# Patient Record
Sex: Male | Born: 1960 | Race: White | Hispanic: No | Marital: Single | Smoking: Current every day smoker
Health system: Southern US, Community
[De-identification: ages and names within clinical notes are randomized; demographics above are authoritative.]

## PROBLEM LIST (undated history)

## (undated) DIAGNOSIS — I10 Essential (primary) hypertension: Secondary | ICD-10-CM

## (undated) DIAGNOSIS — C189 Malignant neoplasm of colon, unspecified: Secondary | ICD-10-CM

## (undated) DIAGNOSIS — I509 Heart failure, unspecified: Secondary | ICD-10-CM

## (undated) DIAGNOSIS — F1910 Other psychoactive substance abuse, uncomplicated: Secondary | ICD-10-CM

## (undated) DIAGNOSIS — R739 Hyperglycemia, unspecified: Secondary | ICD-10-CM

## (undated) DIAGNOSIS — J811 Chronic pulmonary edema: Secondary | ICD-10-CM

## (undated) DIAGNOSIS — E785 Hyperlipidemia, unspecified: Secondary | ICD-10-CM

## (undated) DIAGNOSIS — F17200 Nicotine dependence, unspecified, uncomplicated: Secondary | ICD-10-CM

## (undated) DIAGNOSIS — E119 Type 2 diabetes mellitus without complications: Secondary | ICD-10-CM

## (undated) DIAGNOSIS — I2510 Atherosclerotic heart disease of native coronary artery without angina pectoris: Secondary | ICD-10-CM

## (undated) DIAGNOSIS — J9601 Acute respiratory failure with hypoxia: Secondary | ICD-10-CM

## (undated) DIAGNOSIS — I214 Non-ST elevation (NSTEMI) myocardial infarction: Secondary | ICD-10-CM

## (undated) DIAGNOSIS — J449 Chronic obstructive pulmonary disease, unspecified: Secondary | ICD-10-CM

## (undated) HISTORY — DX: Chronic obstructive pulmonary disease, unspecified: J44.9

## (undated) HISTORY — DX: Malignant neoplasm of colon, unspecified: C18.9

## (undated) HISTORY — DX: Non-ST elevation (NSTEMI) myocardial infarction: I21.4

## (undated) HISTORY — DX: Chronic pulmonary edema: J81.1

## (undated) HISTORY — DX: Other psychoactive substance abuse, uncomplicated: F19.10

## (undated) HISTORY — PX: KNEE SURGERY: SHX244

## (undated) HISTORY — PX: COLONOSCOPY: SHX174

## (undated) HISTORY — PX: COLON RESECTION: SHX5231

## (undated) HISTORY — DX: Acute respiratory failure with hypoxia: J96.01

## (undated) HISTORY — DX: Heart failure, unspecified: I50.9

## (undated) HISTORY — DX: Hyperlipidemia, unspecified: E78.5

## (undated) HISTORY — DX: Nicotine dependence, unspecified, uncomplicated: F17.200

## (undated) HISTORY — DX: Type 2 diabetes mellitus without complications: E11.9

## (undated) HISTORY — DX: Hyperglycemia, unspecified: R73.9

## (undated) HISTORY — DX: Atherosclerotic heart disease of native coronary artery without angina pectoris: I25.10

## (undated) HISTORY — DX: Essential (primary) hypertension: I10

---

## 2007-06-13 ENCOUNTER — Emergency Department (HOSPITAL_COMMUNITY): Admission: EM | Admit: 2007-06-13 | Discharge: 2007-06-14 | Payer: Self-pay | Admitting: Emergency Medicine

## 2007-06-17 ENCOUNTER — Emergency Department (HOSPITAL_COMMUNITY): Admission: EM | Admit: 2007-06-17 | Discharge: 2007-06-18 | Payer: Self-pay | Admitting: Emergency Medicine

## 2008-01-23 ENCOUNTER — Inpatient Hospital Stay (HOSPITAL_COMMUNITY): Admission: EM | Admit: 2008-01-23 | Discharge: 2008-01-24 | Payer: Self-pay | Admitting: Emergency Medicine

## 2008-01-24 ENCOUNTER — Emergency Department (HOSPITAL_COMMUNITY): Admission: EM | Admit: 2008-01-24 | Discharge: 2008-01-25 | Payer: Self-pay | Admitting: Emergency Medicine

## 2008-01-25 ENCOUNTER — Encounter: Payer: Self-pay | Admitting: Emergency Medicine

## 2008-01-26 ENCOUNTER — Emergency Department (HOSPITAL_COMMUNITY): Admission: EM | Admit: 2008-01-26 | Discharge: 2008-01-26 | Payer: Self-pay | Admitting: Emergency Medicine

## 2008-11-30 IMAGING — CT CT ABDOMEN W/ CM
3 of 7 series · 15 of 46 positions shown, 17 images · IV contrast (agent unspecified)
Comparison: 06/18/2007

CT ABDOMEN

CLINICAL DATA: Abdominal pain

CT ABDOMEN AND PELVIS WITH CONTRAST
TECHNIQUE: Multidetector CT imaging of the abdomen and pelvis was
performed using the standard protocol following bolus
administration of intravenous contrast.
Contrast: 100 ml Imnipaque-GDD

[Series 2: abd_pel 5.0 b40f st · axial · 0.69mm/px · z∈[-520,-114]mm · 10 of 101 slices shown, 12 images]
[im 10/101  soft-tissue]
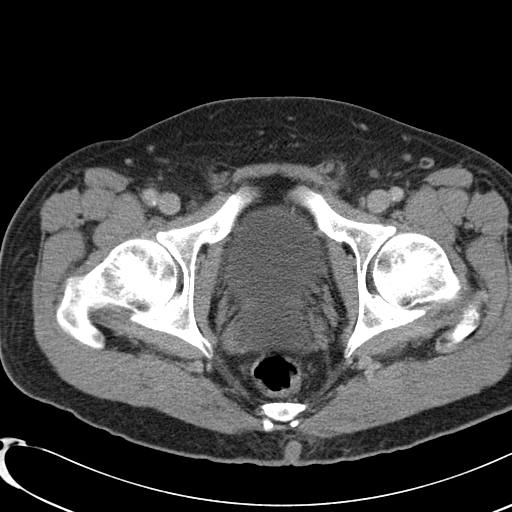
[im 10/101  bone]
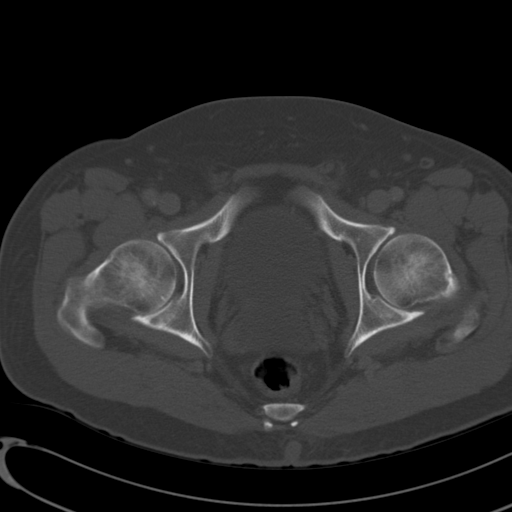
[im 19/101  soft-tissue]
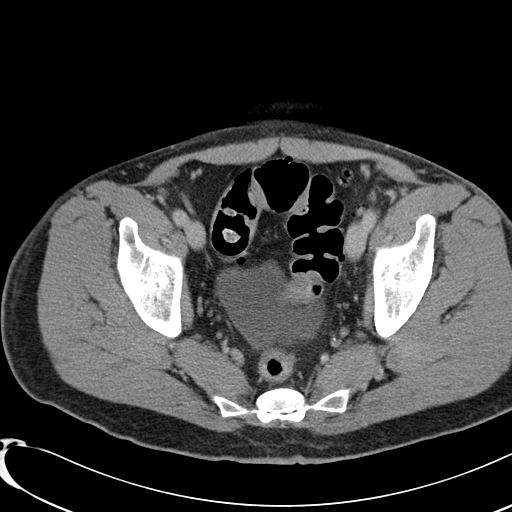
[im 28/101  soft-tissue]
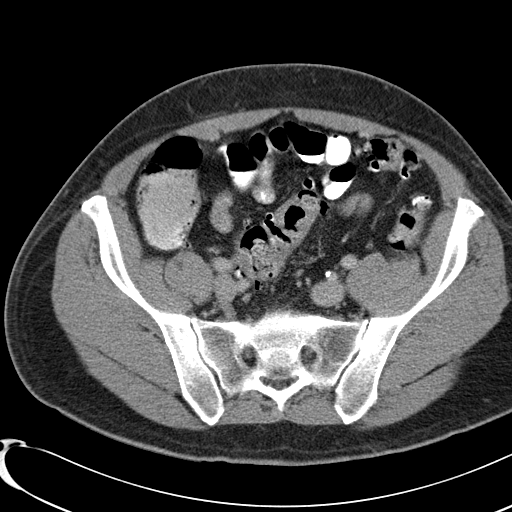
[im 37/101  soft-tissue]
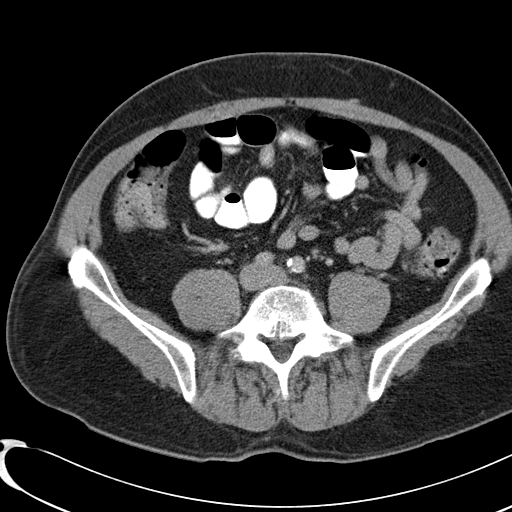
[im 46/101  soft-tissue]
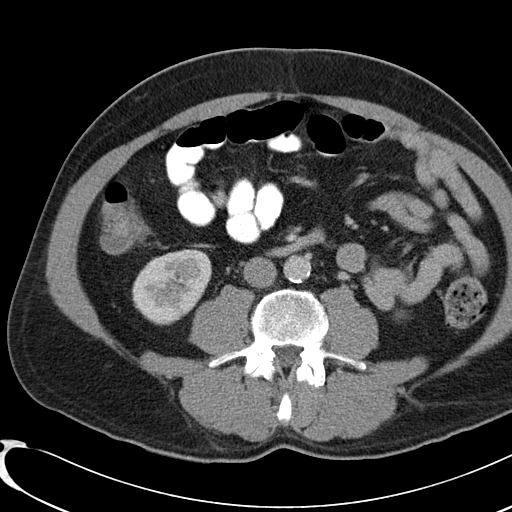
[im 55/101  soft-tissue]
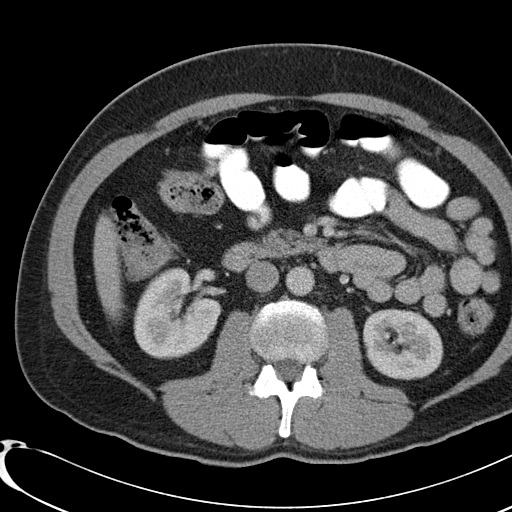
[im 64/101  soft-tissue]
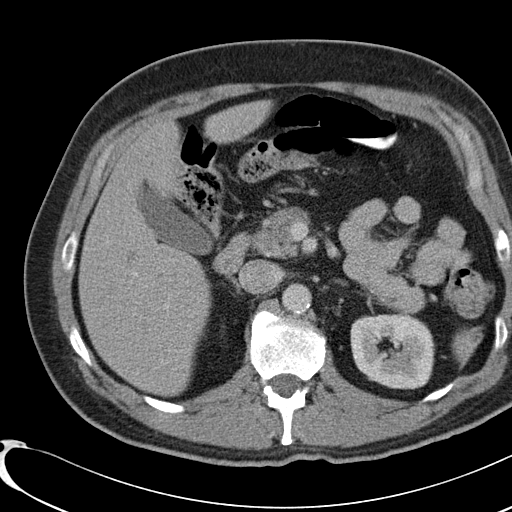
[im 73/101  soft-tissue]
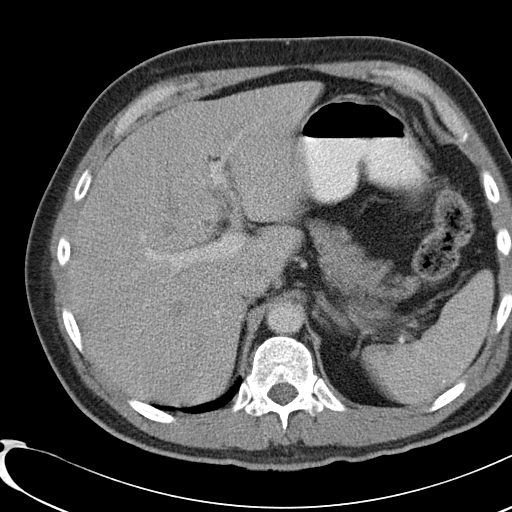
[im 82/101  soft-tissue]
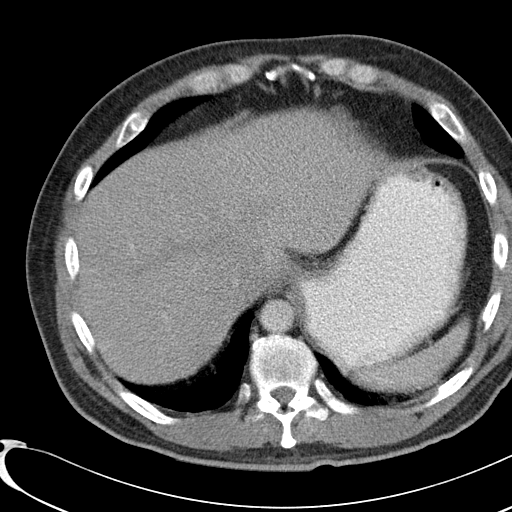
[im 82/101  bone]
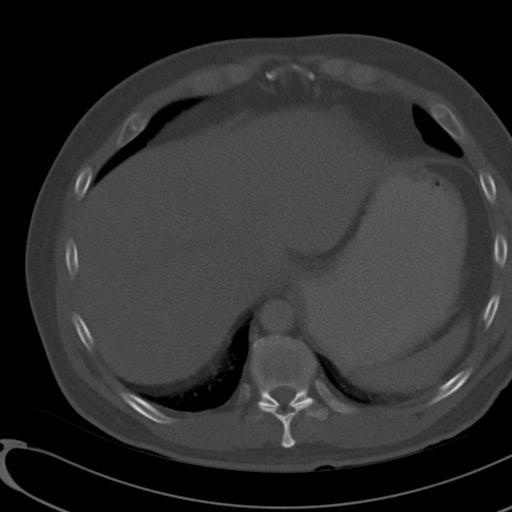
[im 91/101  soft-tissue]
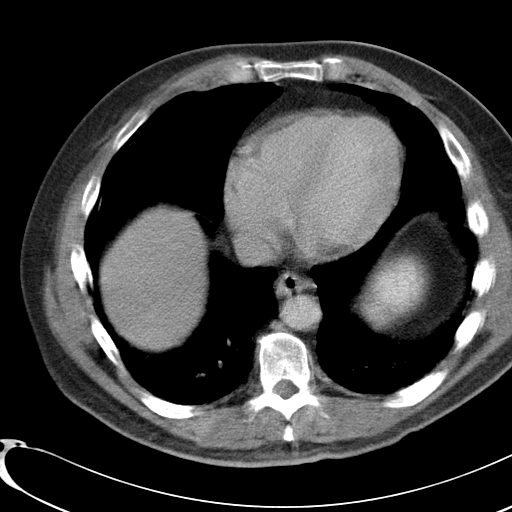

[Series 4: abd_pel 5.0 b60f lung · axial · 0.73mm/px · z∈[-164,-114]mm · 2 of 31 slices shown]
[im 11/31  bone]
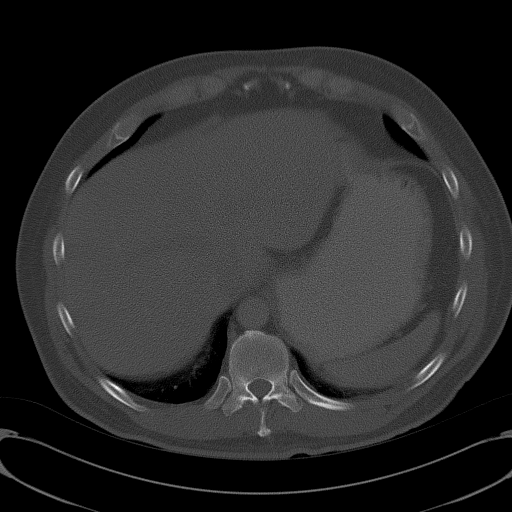
[im 21/31  bone]
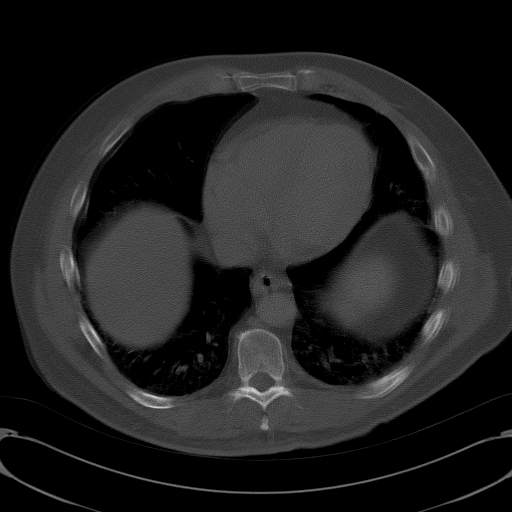

[Series 602: <mpr thick range> · coronal · 1.01mm/px · 3 of 81 slices shown]
[im 27/81  soft-tissue]
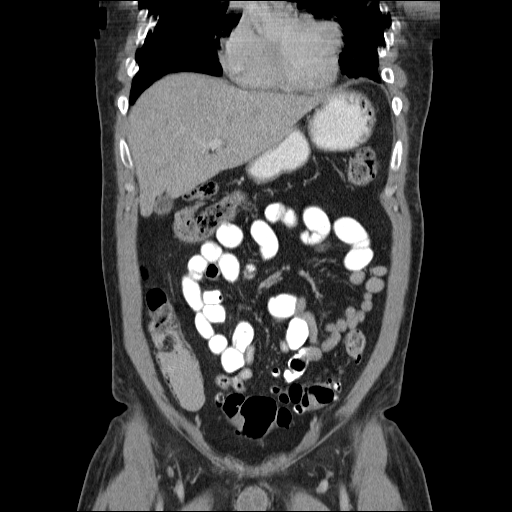
[im 36/81  soft-tissue]
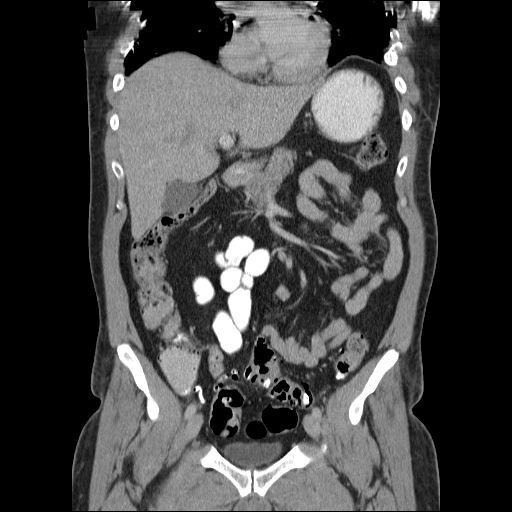
[im 45/81  soft-tissue]
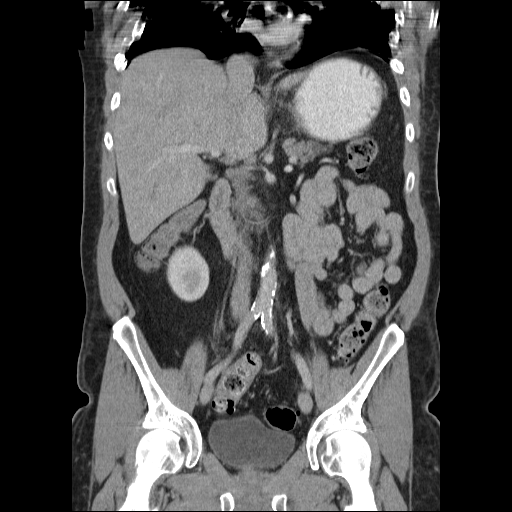

[15 of 46 positions shown; findings below may reference images not displayed]

FINDINGS: Limited evaluation of the lung bases due to motion
artifact.  The liver is unremarkable.  No biliary dilatation.
There is a small fundal nodule in the gallbladder which may be a
polyp or focal adenomyoma.  This is stable.

The spleen, pancreas, adrenal glands and kidneys are unremarkable.
There are a few tiny right renal calculi.

The aorta is normal in caliber.  Mild atherosclerotic changes
distally.

The stomach, duodenum, small bowel and colon demonstrate no
significant abnormalities.  No mesenteric or retroperitoneal masses
or adenopathy.  Scattered colonic diverticuli are noted.  No
significant bony findings.
IMPRESSION: 1.  No acute abdominal findings, mass lesions or adenopathy.

CT PELVIS
FINDINGS: The rectum, sigmoid colon and visualized small bowel
loops are unremarkable except for diverticulosis of the sigmoid
colon.  There is associated muscular thickening but no
diverticulitis.  The appendix is visualized and is normal.  The
bladder is unremarkable.  No pelvic masses, adenopathy or free
pelvic fluid collections.  The bony pelvis is intact.  No inguinal
mass or hernia.
IMPRESSION: 1.  Diverticulosis of the sigmoid colon.  No evidence for acute
diverticulitis.
2.  No pelvic masses, adenopathy or free pelvic fluid collections.
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
## 2011-04-26 LAB — COMPREHENSIVE METABOLIC PANEL
AST: 20
Alkaline Phosphatase: 54
BUN: 8
CO2: 24
Calcium: 9.1
Creatinine, Ser: 1.08
Glucose, Bld: 97
Total Bilirubin: 0.8
Total Protein: 6.7

## 2011-04-26 LAB — DIFFERENTIAL
Basophils Absolute: 0
Basophils Absolute: 0.1
Basophils Relative: 1
Eosinophils Absolute: 0.1
Lymphs Abs: 3.7
Monocytes Absolute: 0.7
Monocytes Relative: 9
Neutro Abs: 4
Neutro Abs: 4.1
Neutrophils Relative %: 48

## 2011-04-26 LAB — BASIC METABOLIC PANEL
BUN: 7
Chloride: 107
Chloride: 109
GFR calc Af Amer: >60
GFR calc non Af Amer: >60
GFR calc non Af Amer: >60

## 2011-04-26 LAB — URINALYSIS, ROUTINE W REFLEX MICROSCOPIC
Bilirubin Urine: NEGATIVE
Bilirubin Urine: NEGATIVE
Glucose, UA: NEGATIVE
Ketones, ur: NEGATIVE
Ketones, ur: NEGATIVE
Nitrite: NEGATIVE
Specific Gravity, Urine: 1.007
pH: 6

## 2011-04-26 LAB — CBC
HCT: 39.4
HCT: 42
HCT: 44.3
Hemoglobin: 13.6
MCHC: 34.5
MCV: 96.8
MCV: 98.1
RBC: 4.06 — ABNORMAL LOW
WBC: 8.7
WBC: 8.7

## 2011-04-26 LAB — TRICYCLICS SCREEN, URINE: TCA Scrn: NOT DETECTED

## 2011-04-26 LAB — SALICYLATE LEVEL: Salicylate Lvl: <4

## 2011-04-26 LAB — ACETAMINOPHEN LEVEL: Acetaminophen (Tylenol), Serum: <10 — ABNORMAL LOW

## 2011-04-26 LAB — RAPID URINE DRUG SCREEN, HOSP PERFORMED
Cocaine: NOT DETECTED
Opiates: NOT DETECTED

## 2011-04-26 LAB — ETHANOL: Alcohol, Ethyl (B): 185 — ABNORMAL HIGH

## 2011-04-26 LAB — LIPASE, BLOOD: Lipase: 16

## 2011-05-08 LAB — CBC
HCT: 46.5
Hemoglobin: 16.6
Hemoglobin: 15.9
MCHC: 34.3
MCV: 97.6
RBC: 4.88
RBC: 4.77
RDW: 14.2
WBC: 9.2

## 2011-05-08 LAB — DIFFERENTIAL
Basophils Absolute: 0
Basophils Relative: 0
Eosinophils Absolute: 0 — ABNORMAL LOW
Lymphocytes Relative: 35
Lymphs Abs: 3.9
Monocytes Absolute: 0.8
Monocytes Absolute: 0.6
Monocytes Relative: 6
Neutro Abs: 5.8
Neutrophils Relative %: 56
Neutrophils Relative %: 52

## 2011-05-08 LAB — I-STAT 8, (EC8 V) (CONVERTED LAB)
Acid-base deficit: 2
BUN: 10
Chloride: 114 — ABNORMAL HIGH
HCT: 51
Hemoglobin: 17.3 — ABNORMAL HIGH
Operator id: 272551
Potassium: 3.9
Sodium: 144
pCO2, Ven: 34.5 — ABNORMAL LOW

## 2011-05-08 LAB — RAPID URINE DRUG SCREEN, HOSP PERFORMED
Barbiturates: NOT DETECTED
Opiates: NOT DETECTED

## 2011-05-08 LAB — ETHANOL: Alcohol, Ethyl (B): 83 — ABNORMAL HIGH

## 2011-05-08 LAB — HEPATIC FUNCTION PANEL
ALT: 42
AST: 42 — ABNORMAL HIGH
Albumin: 4
Bilirubin, Direct: 0.2

## 2011-05-08 LAB — POCT I-STAT CREATININE: Creatinine, Ser: 1.2

## 2020-04-28 ENCOUNTER — Other Ambulatory Visit: Payer: Self-pay

## 2020-04-28 ENCOUNTER — Encounter (HOSPITAL_COMMUNITY): Payer: Self-pay | Admitting: *Deleted

## 2020-04-28 ENCOUNTER — Emergency Department (HOSPITAL_COMMUNITY)
Admission: EM | Admit: 2020-04-28 | Discharge: 2020-04-28 | Disposition: A | Discharge status: Home / Self Care | Payer: Medicaid Other | Attending: Emergency Medicine | Admitting: Emergency Medicine

## 2020-04-28 DIAGNOSIS — Z5321 Procedure and treatment not carried out due to patient leaving prior to being seen by health care provider: Secondary | ICD-10-CM | POA: Diagnosis not present

## 2020-04-28 DIAGNOSIS — M545 Low back pain: Secondary | ICD-10-CM | POA: Diagnosis not present
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
## 2022-09-12 DIAGNOSIS — J9601 Acute respiratory failure with hypoxia: Secondary | ICD-10-CM

## 2022-09-12 DIAGNOSIS — I10 Essential (primary) hypertension: Secondary | ICD-10-CM

## 2022-09-12 DIAGNOSIS — I214 Non-ST elevation (NSTEMI) myocardial infarction: Secondary | ICD-10-CM

## 2022-09-12 DIAGNOSIS — I509 Heart failure, unspecified: Secondary | ICD-10-CM

## 2022-09-13 DIAGNOSIS — I509 Heart failure, unspecified: Secondary | ICD-10-CM | POA: Diagnosis not present

## 2022-09-13 DIAGNOSIS — I214 Non-ST elevation (NSTEMI) myocardial infarction: Secondary | ICD-10-CM | POA: Diagnosis not present

## 2022-09-13 DIAGNOSIS — I10 Essential (primary) hypertension: Secondary | ICD-10-CM | POA: Diagnosis not present

## 2022-09-13 DIAGNOSIS — J9601 Acute respiratory failure with hypoxia: Secondary | ICD-10-CM | POA: Diagnosis not present

## 2022-09-14 DIAGNOSIS — I214 Non-ST elevation (NSTEMI) myocardial infarction: Secondary | ICD-10-CM | POA: Diagnosis not present

## 2022-09-14 DIAGNOSIS — I509 Heart failure, unspecified: Secondary | ICD-10-CM | POA: Diagnosis not present

## 2022-09-14 DIAGNOSIS — J9601 Acute respiratory failure with hypoxia: Secondary | ICD-10-CM | POA: Diagnosis not present

## 2022-09-14 DIAGNOSIS — I10 Essential (primary) hypertension: Secondary | ICD-10-CM | POA: Diagnosis not present

## 2022-09-15 DIAGNOSIS — I214 Non-ST elevation (NSTEMI) myocardial infarction: Secondary | ICD-10-CM

## 2022-09-15 DIAGNOSIS — R079 Chest pain, unspecified: Secondary | ICD-10-CM

## 2022-09-15 DIAGNOSIS — F17200 Nicotine dependence, unspecified, uncomplicated: Secondary | ICD-10-CM

## 2022-09-15 DIAGNOSIS — I10 Essential (primary) hypertension: Secondary | ICD-10-CM

## 2022-09-20 ENCOUNTER — Encounter: Payer: Self-pay | Admitting: Cardiology

## 2022-10-19 ENCOUNTER — Telehealth: Payer: Self-pay | Admitting: Cardiology

## 2022-10-19 MED ORDER — CARVEDILOL 6.25 MG PO TABS: 6.2500 mg | ORAL_TABLET | Freq: Two times a day (BID) | ORAL | 0 refills | Status: DC

## 2022-10-19 NOTE — Telephone Encounter (Signed)
*  STAT* If patient is at the pharmacy, call can be transferred to refill team.   1. Which medications need to be refilled? (please list name of each medication and dose if known)   lasix  carvediol  2. Which pharmacy/location (including street and city if local pharmacy) is medication to be sent to?  [id number] - [place], [place] - [address]    3. Do they need a 30 day or 90 day supply? 90   Patient has been out of medication for 2 days

## 2022-10-24 ENCOUNTER — Other Ambulatory Visit: Payer: Self-pay

## 2022-10-24 DIAGNOSIS — J9601 Acute respiratory failure with hypoxia: Secondary | ICD-10-CM | POA: Insufficient documentation

## 2022-10-24 DIAGNOSIS — F1910 Other psychoactive substance abuse, uncomplicated: Secondary | ICD-10-CM | POA: Insufficient documentation

## 2022-10-24 DIAGNOSIS — I509 Heart failure, unspecified: Secondary | ICD-10-CM | POA: Insufficient documentation

## 2022-10-24 DIAGNOSIS — F17200 Nicotine dependence, unspecified, uncomplicated: Secondary | ICD-10-CM | POA: Insufficient documentation

## 2022-10-24 DIAGNOSIS — I10 Essential (primary) hypertension: Secondary | ICD-10-CM | POA: Insufficient documentation

## 2022-10-24 DIAGNOSIS — R739 Hyperglycemia, unspecified: Secondary | ICD-10-CM | POA: Insufficient documentation

## 2022-10-24 DIAGNOSIS — I214 Non-ST elevation (NSTEMI) myocardial infarction: Secondary | ICD-10-CM

## 2022-11-02 ENCOUNTER — Ambulatory Visit: Payer: Medicaid Other | Admitting: Cardiology

## 2022-11-09 ENCOUNTER — Other Ambulatory Visit: Payer: Self-pay | Admitting: Cardiology

## 2023-03-13 DIAGNOSIS — F1121 Opioid dependence, in remission: Secondary | ICD-10-CM | POA: Diagnosis not present

## 2023-03-16 DIAGNOSIS — I517 Cardiomegaly: Secondary | ICD-10-CM | POA: Diagnosis not present

## 2023-03-16 DIAGNOSIS — I5023 Acute on chronic systolic (congestive) heart failure: Secondary | ICD-10-CM | POA: Diagnosis not present

## 2023-03-16 DIAGNOSIS — M7989 Other specified soft tissue disorders: Secondary | ICD-10-CM | POA: Diagnosis not present

## 2023-03-16 DIAGNOSIS — J9601 Acute respiratory failure with hypoxia: Secondary | ICD-10-CM | POA: Diagnosis not present

## 2023-03-16 DIAGNOSIS — R000 Tachycardia, unspecified: Secondary | ICD-10-CM | POA: Diagnosis not present

## 2023-03-16 DIAGNOSIS — J810 Acute pulmonary edema: Secondary | ICD-10-CM | POA: Diagnosis not present

## 2023-03-16 DIAGNOSIS — I110 Hypertensive heart disease with heart failure: Secondary | ICD-10-CM | POA: Diagnosis not present

## 2023-03-16 DIAGNOSIS — R9431 Abnormal electrocardiogram [ECG] [EKG]: Secondary | ICD-10-CM | POA: Diagnosis not present

## 2023-03-17 DIAGNOSIS — J9601 Acute respiratory failure with hypoxia: Secondary | ICD-10-CM | POA: Diagnosis not present

## 2023-03-17 DIAGNOSIS — I110 Hypertensive heart disease with heart failure: Secondary | ICD-10-CM | POA: Diagnosis not present

## 2023-03-17 DIAGNOSIS — I5023 Acute on chronic systolic (congestive) heart failure: Secondary | ICD-10-CM | POA: Diagnosis not present

## 2023-03-18 DIAGNOSIS — J9601 Acute respiratory failure with hypoxia: Secondary | ICD-10-CM | POA: Diagnosis not present

## 2023-03-18 DIAGNOSIS — I5023 Acute on chronic systolic (congestive) heart failure: Secondary | ICD-10-CM | POA: Diagnosis not present

## 2023-03-18 DIAGNOSIS — I110 Hypertensive heart disease with heart failure: Secondary | ICD-10-CM | POA: Diagnosis not present

## 2023-03-27 DIAGNOSIS — I700 Atherosclerosis of aorta: Secondary | ICD-10-CM | POA: Diagnosis not present

## 2023-03-27 DIAGNOSIS — M25562 Pain in left knee: Secondary | ICD-10-CM | POA: Diagnosis not present

## 2023-03-27 DIAGNOSIS — R0602 Shortness of breath: Secondary | ICD-10-CM | POA: Diagnosis not present

## 2023-03-27 DIAGNOSIS — J449 Chronic obstructive pulmonary disease, unspecified: Secondary | ICD-10-CM | POA: Diagnosis not present

## 2023-04-18 DIAGNOSIS — J9601 Acute respiratory failure with hypoxia: Secondary | ICD-10-CM | POA: Diagnosis not present

## 2023-04-18 DIAGNOSIS — J181 Lobar pneumonia, unspecified organism: Secondary | ICD-10-CM | POA: Diagnosis not present

## 2023-04-18 DIAGNOSIS — E1159 Type 2 diabetes mellitus with other circulatory complications: Secondary | ICD-10-CM | POA: Diagnosis not present

## 2023-04-18 DIAGNOSIS — H5440 Blindness, one eye, unspecified eye: Secondary | ICD-10-CM | POA: Diagnosis not present

## 2023-04-18 DIAGNOSIS — I2089 Other forms of angina pectoris: Secondary | ICD-10-CM | POA: Diagnosis not present

## 2023-04-18 DIAGNOSIS — I10 Essential (primary) hypertension: Secondary | ICD-10-CM | POA: Diagnosis not present

## 2023-04-18 DIAGNOSIS — R5383 Other fatigue: Secondary | ICD-10-CM | POA: Diagnosis not present

## 2023-04-18 DIAGNOSIS — C189 Malignant neoplasm of colon, unspecified: Secondary | ICD-10-CM | POA: Diagnosis not present

## 2023-04-18 DIAGNOSIS — J449 Chronic obstructive pulmonary disease, unspecified: Secondary | ICD-10-CM | POA: Diagnosis not present

## 2023-04-18 DIAGNOSIS — I509 Heart failure, unspecified: Secondary | ICD-10-CM | POA: Diagnosis not present

## 2023-04-18 DIAGNOSIS — I255 Ischemic cardiomyopathy: Secondary | ICD-10-CM | POA: Diagnosis not present

## 2023-04-18 DIAGNOSIS — E7800 Pure hypercholesterolemia, unspecified: Secondary | ICD-10-CM | POA: Diagnosis not present

## 2023-04-18 DIAGNOSIS — I2510 Atherosclerotic heart disease of native coronary artery without angina pectoris: Secondary | ICD-10-CM | POA: Diagnosis not present

## 2023-04-18 DIAGNOSIS — J9611 Chronic respiratory failure with hypoxia: Secondary | ICD-10-CM | POA: Diagnosis not present

## 2023-04-19 DIAGNOSIS — J9601 Acute respiratory failure with hypoxia: Secondary | ICD-10-CM | POA: Diagnosis not present

## 2023-04-19 DIAGNOSIS — J181 Lobar pneumonia, unspecified organism: Secondary | ICD-10-CM | POA: Diagnosis not present

## 2023-04-19 DIAGNOSIS — I509 Heart failure, unspecified: Secondary | ICD-10-CM | POA: Diagnosis not present

## 2023-04-19 DIAGNOSIS — J180 Bronchopneumonia, unspecified organism: Secondary | ICD-10-CM | POA: Diagnosis not present

## 2023-04-25 DIAGNOSIS — R7989 Other specified abnormal findings of blood chemistry: Secondary | ICD-10-CM | POA: Diagnosis not present

## 2023-04-25 DIAGNOSIS — J9611 Chronic respiratory failure with hypoxia: Secondary | ICD-10-CM | POA: Diagnosis not present

## 2023-04-25 DIAGNOSIS — E1140 Type 2 diabetes mellitus with diabetic neuropathy, unspecified: Secondary | ICD-10-CM | POA: Diagnosis not present

## 2023-04-25 DIAGNOSIS — I2510 Atherosclerotic heart disease of native coronary artery without angina pectoris: Secondary | ICD-10-CM | POA: Diagnosis not present

## 2023-04-25 DIAGNOSIS — I10 Essential (primary) hypertension: Secondary | ICD-10-CM | POA: Diagnosis not present

## 2023-04-25 DIAGNOSIS — J449 Chronic obstructive pulmonary disease, unspecified: Secondary | ICD-10-CM | POA: Diagnosis not present

## 2023-04-25 DIAGNOSIS — I509 Heart failure, unspecified: Secondary | ICD-10-CM | POA: Diagnosis not present

## 2023-04-25 DIAGNOSIS — C189 Malignant neoplasm of colon, unspecified: Secondary | ICD-10-CM | POA: Diagnosis not present

## 2023-04-25 DIAGNOSIS — E7800 Pure hypercholesterolemia, unspecified: Secondary | ICD-10-CM | POA: Diagnosis not present

## 2023-04-25 DIAGNOSIS — E1159 Type 2 diabetes mellitus with other circulatory complications: Secondary | ICD-10-CM | POA: Diagnosis not present

## 2023-04-25 DIAGNOSIS — I255 Ischemic cardiomyopathy: Secondary | ICD-10-CM | POA: Diagnosis not present

## 2023-04-25 DIAGNOSIS — I2089 Other forms of angina pectoris: Secondary | ICD-10-CM | POA: Diagnosis not present

## 2023-04-26 ENCOUNTER — Inpatient Hospital Stay: Payer: 59 | Admitting: Oncology

## 2023-04-26 ENCOUNTER — Inpatient Hospital Stay: Payer: 59

## 2023-05-02 DIAGNOSIS — I255 Ischemic cardiomyopathy: Secondary | ICD-10-CM | POA: Diagnosis not present

## 2023-05-02 DIAGNOSIS — I10 Essential (primary) hypertension: Secondary | ICD-10-CM | POA: Diagnosis not present

## 2023-05-02 DIAGNOSIS — I509 Heart failure, unspecified: Secondary | ICD-10-CM | POA: Diagnosis not present

## 2023-05-02 DIAGNOSIS — B1920 Unspecified viral hepatitis C without hepatic coma: Secondary | ICD-10-CM | POA: Diagnosis not present

## 2023-05-02 DIAGNOSIS — E7800 Pure hypercholesterolemia, unspecified: Secondary | ICD-10-CM | POA: Diagnosis not present

## 2023-05-02 DIAGNOSIS — R7989 Other specified abnormal findings of blood chemistry: Secondary | ICD-10-CM | POA: Diagnosis not present

## 2023-05-02 DIAGNOSIS — J449 Chronic obstructive pulmonary disease, unspecified: Secondary | ICD-10-CM | POA: Diagnosis not present

## 2023-05-02 DIAGNOSIS — E1140 Type 2 diabetes mellitus with diabetic neuropathy, unspecified: Secondary | ICD-10-CM | POA: Diagnosis not present

## 2023-05-02 DIAGNOSIS — J9611 Chronic respiratory failure with hypoxia: Secondary | ICD-10-CM | POA: Diagnosis not present

## 2023-05-02 DIAGNOSIS — I2089 Other forms of angina pectoris: Secondary | ICD-10-CM | POA: Diagnosis not present

## 2023-05-02 DIAGNOSIS — E1159 Type 2 diabetes mellitus with other circulatory complications: Secondary | ICD-10-CM | POA: Diagnosis not present

## 2023-05-02 DIAGNOSIS — I2510 Atherosclerotic heart disease of native coronary artery without angina pectoris: Secondary | ICD-10-CM | POA: Diagnosis not present

## 2023-05-06 ENCOUNTER — Inpatient Hospital Stay: Payer: 59

## 2023-05-06 ENCOUNTER — Encounter: Payer: Self-pay | Admitting: Oncology

## 2023-05-06 ENCOUNTER — Inpatient Hospital Stay: Payer: 59 | Attending: Oncology | Admitting: Oncology

## 2023-05-06 VITALS — BP 155/98 | HR 82 | Resp 14 | Ht 69.5 in | Wt 193.8 lb

## 2023-05-06 DIAGNOSIS — C189 Malignant neoplasm of colon, unspecified: Secondary | ICD-10-CM

## 2023-05-06 DIAGNOSIS — R112 Nausea with vomiting, unspecified: Secondary | ICD-10-CM | POA: Diagnosis not present

## 2023-05-06 DIAGNOSIS — K921 Melena: Secondary | ICD-10-CM | POA: Insufficient documentation

## 2023-05-06 DIAGNOSIS — F17210 Nicotine dependence, cigarettes, uncomplicated: Secondary | ICD-10-CM | POA: Insufficient documentation

## 2023-05-06 DIAGNOSIS — R1032 Left lower quadrant pain: Secondary | ICD-10-CM | POA: Diagnosis not present

## 2023-05-06 DIAGNOSIS — F1111 Opioid abuse, in remission: Secondary | ICD-10-CM

## 2023-05-06 DIAGNOSIS — F1290 Cannabis use, unspecified, uncomplicated: Secondary | ICD-10-CM | POA: Diagnosis not present

## 2023-05-06 DIAGNOSIS — B1920 Unspecified viral hepatitis C without hepatic coma: Secondary | ICD-10-CM | POA: Insufficient documentation

## 2023-05-06 DIAGNOSIS — Z85038 Personal history of other malignant neoplasm of large intestine: Secondary | ICD-10-CM | POA: Diagnosis not present

## 2023-05-06 HISTORY — DX: Unspecified viral hepatitis C without hepatic coma: B19.20

## 2023-05-06 HISTORY — DX: Opioid abuse, in remission: F11.11

## 2023-05-06 LAB — CBC WITH DIFFERENTIAL/PLATELET
Abs Immature Granulocytes: 0.04 10*3/uL (ref 0.00–0.07)
Basophils Absolute: 0 10*3/uL (ref 0.0–0.1)
Basophils Relative: 0 %
Eosinophils Absolute: 0.1 10*3/uL (ref 0.0–0.5)
Eosinophils Relative: 1 %
HCT: 43.7 % (ref 39.0–52.0)
Hemoglobin: 14.3 g/dL (ref 13.0–17.0)
Immature Granulocytes: 1 %
Lymphocytes Relative: 24 %
Lymphs Abs: 1.9 10*3/uL (ref 0.7–4.0)
MCH: 30.9 pg (ref 26.0–34.0)
MCHC: 32.7 g/dL (ref 30.0–36.0)
MCV: 94.4 fL (ref 80.0–100.0)
Monocytes Absolute: 0.7 10*3/uL (ref 0.1–1.0)
Monocytes Relative: 9 %
Neutro Abs: 5.3 10*3/uL (ref 1.7–7.7)
Neutrophils Relative %: 65 %
Platelets: 192 10*3/uL (ref 150–400)
RBC: 4.63 MIL/uL (ref 4.22–5.81)
RDW: 15 % (ref 11.5–15.5)
WBC: 8 10*3/uL (ref 4.0–10.5)
nRBC: 0 % (ref 0.0–0.2)

## 2023-05-06 LAB — COMPREHENSIVE METABOLIC PANEL
ALT: 39 U/L (ref 0–44)
AST: 37 U/L (ref 15–41)
Albumin: 3.8 g/dL (ref 3.5–5.0)
Alkaline Phosphatase: 107 U/L (ref 38–126)
Anion gap: 8 (ref 5–15)
BUN: 18 mg/dL (ref 8–23)
CO2: 25 mmol/L (ref 22–32)
Calcium: 8.7 mg/dL — ABNORMAL LOW (ref 8.9–10.3)
Chloride: 105 mmol/L (ref 98–111)
Creatinine, Ser: 0.77 mg/dL (ref 0.61–1.24)
GFR, Estimated: >60 mL/min (ref >60)
Glucose, Bld: 118 mg/dL — ABNORMAL HIGH (ref 70–99)
Potassium: 4.4 mmol/L (ref 3.5–5.1)
Sodium: 138 mmol/L (ref 135–145)
Total Bilirubin: 1 mg/dL (ref 0.3–1.2)
Total Protein: 7.2 g/dL (ref 6.5–8.1)

## 2023-05-06 LAB — HEPATITIS PANEL, ACUTE
HCV Ab: REACTIVE — AB
Hep A IgM: NONREACTIVE
Hep B C IgM: NONREACTIVE
Hepatitis B Surface Ag: NONREACTIVE

## 2023-05-06 NOTE — Progress Notes (Signed)
[facility] Cancer Initial Visit:  Patient Care Team: Patient, No Pcp Per as PCP - General (General Practice)  CHIEF COMPLAINTS/PURPOSE OF CONSULTATION:  Oncology History  Colon cancer (HCC)  05/06/2023 Initial Diagnosis   Colon cancer (HCC)     HISTORY OF PRESENTING ILLNESS: Michael Ochoa 62 y.o. male is here because of colon cancer Medical history is notable for chronic respiratory failure with hypoxemia for which required supplemental oxygen, congestive heart failure, coronary artery disease with non-STEMI, diabetes mellitus type 2, hypertension, hyperlipidemia, osteoarthritis, history of heroin abuse, multiple pulmonary nodules, hepatitis B  May 06 2023:  [facility] Consult Patient states that he was diagnosed with colon cancer somewhere between 1999 and 2001 while living in [place], [place] after presenting with LLQ abdominal pain.   He states that a colonoscopy was performed but could not be completed because he experienced pain during the procedure.  He subsequently underwent a barium enema which patient states indicated that he had colon cancer.   He states that he never had surgery because he was told that he might need a colostomy.  Patient was lost to follow up due to drug addiction and imprisonment.  He states that he is seeking medical attention now because he is having LLQ abdominal pain, nausea, emesis and hematochezia.      Social:  Disabled.  Tobacco began 50 years ago up to 1 1/2 ppd now down to 1/2 ppd.  EtOH not currently but drank heavily in the past.  Years ago was addicted to heroin but tried a bit of everything.  Now smokes pot and takes suboxone  [facility] Mother alive 85 brain aneurysm.   Father died 34 heart disease Brother alive 7 heart disease   Review of Systems  Constitutional:  Positive for appetite change. Negative for chills, fatigue and fever.       Has lost about 15 lbs in the past 3 months  HENT:   Negative for mouth sores,  nosebleeds, sore throat, tinnitus, trouble swallowing and voice change.   Eyes:  Negative for icterus.       Decreased vision right eye  Respiratory:  Positive for cough. Negative for chest tightness, hemoptysis and wheezing.        DOE walking short distances  Cardiovascular:  Positive for leg swelling and palpitations. Negative for chest pain.       PND:  none Orthopnea:  none  Gastrointestinal:  Positive for abdominal pain, blood in stool, nausea, rectal pain and vomiting. Negative for constipation and diarrhea.  Endocrine: Negative for hot flashes.       Cold intolerance:  none Heat intolerance:  none  Genitourinary:  Negative for bladder incontinence, difficulty urinating, dysuria, frequency, hematuria and nocturia.   Musculoskeletal:  Negative for arthralgias, back pain, myalgias, neck pain and neck stiffness.  Skin:  Negative for itching, rash and wound.  Neurological:  Negative for numbness.       Occasional dizziness and HA's.  Gait problems due to fasciotomy left calf  Hematological:  Negative for adenopathy. Does not bruise/bleed easily.  Psychiatric/Behavioral:  Positive for sleep disturbance. Negative for suicidal ideas. The patient is not nervous/anxious.     MEDICAL HISTORY: Past Medical History:  Diagnosis Date   Acute respiratory failure with hypoxia (HCC)    CAD (coronary artery disease)    CHF (congestive heart failure) (HCC)    Chronic pulmonary edema    Colon cancer (HCC)    COPD (chronic obstructive pulmonary disease) (HCC)  Diabetes mellitus without complication (HCC)    Essential (primary) hypertension    Heart failure, unspecified (HCC)    Hyperglycemia, unspecified    Hyperlipidemia    Nicotine dependence, unspecified, uncomplicated    Non-ST elevation myocardial infarction (NSTEMI) (HCC)    Other psychoactive substance abuse, uncomplicated (HCC)     SURGICAL HISTORY: Past Surgical History:  Procedure Laterality Date   COLON RESECTION      COLONOSCOPY     KNEE SURGERY Left     SOCIAL HISTORY: Social History   Socioeconomic History   Marital status: Single    Spouse name: Not on file   Number of children: Not on file   Years of education: Not on file   Highest education level: Not on file  Occupational History   Not on file  Tobacco Use   Smoking status: Every Day    Types: Cigarettes   Smokeless tobacco: Never  Vaping Use   Vaping status: Never Used  Substance and Sexual Activity   Alcohol use: Not Currently   Drug use: Yes    Types: Marijuana, Codeine, Heroin, Cocaine    Comment: Only using marijuana   Sexual activity: Not on file  Other Topics Concern   Not on file  Social History Narrative   Not on file   Social Determinants of Health   Financial Resource Strain: Not on file  Food Insecurity: Not on file  Transportation Needs: Not on file  Physical Activity: Not on file  Stress: Not on file  Social Connections: Not on file  Intimate Partner Violence: Not on file    FAMILY HISTORY Family History  Problem Relation Age of Onset   Heart disease Father    Heart disease Brother     ALLERGIES:  is allergic to codeine and penicillins.  MEDICATIONS:  Current Outpatient Medications  Medication Sig Dispense Refill   atorvastatin (LIPITOR) 40 MG tablet Take 40 mg by mouth daily.     carvedilol (COREG) 6.25 MG tablet Take 1 tablet (6.25 mg total) by mouth 2 (two) times daily. Needs appointment for future refills / 1st attempt 60 tablet 0   ENTRESTO 24-26 MG Take 1 tablet by mouth 2 (two) times daily.     furosemide (LASIX) 20 MG tablet Take 20 mg by mouth daily.     gabapentin (NEURONTIN) 300 MG capsule Take 300 mg by mouth 3 (three) times daily.     SUBOXONE 8-2 MG FILM Place 1 Film under the tongue 3 (three) times daily.     No current facility-administered medications for this visit.    PHYSICAL EXAMINATION:  ECOG PERFORMANCE STATUS: 2 - Symptomatic, <50% confined to bed   Vitals:    05/06/23 1112  BP: (!) 155/98  Pulse: 82  Resp: 14  SpO2: 100%    Filed Weights   05/06/23 1112  Weight: 193 lb 12.8 oz (87.9 kg)     Physical Exam Vitals and nursing note reviewed.  Constitutional:      General: He is not in acute distress.    Appearance: Normal appearance. He is not diaphoretic.     Comments: Here with fiancee.  Unkempt  HENT:     Head: Normocephalic and atraumatic.     Right Ear: External ear normal.     Left Ear: External ear normal.     Nose: Nose normal.  Eyes:     General: No scleral icterus.    Conjunctiva/sclera: Conjunctivae normal.     Pupils: Pupils are equal, round,  and reactive to light.  Cardiovascular:     Rate and Rhythm: Normal rate and regular rhythm.     Heart sounds: Normal heart sounds. No murmur heard.    No friction rub. No gallop.  Pulmonary:     Effort: Pulmonary effort is normal. No respiratory distress.     Breath sounds: Normal breath sounds. No stridor.  Abdominal:     General: There is no distension.     Palpations: Abdomen is soft.     Tenderness: There is no abdominal tenderness. There is no guarding or rebound.  Musculoskeletal:        General: No swelling or tenderness.     Cervical back: Normal range of motion and neck supple. No rigidity or tenderness.     Right lower leg: No edema.     Left lower leg: No edema.  Lymphadenopathy:     Head:     Right side of head: No submental, submandibular, tonsillar, preauricular, posterior auricular or occipital adenopathy.     Left side of head: No submental, submandibular, tonsillar, preauricular, posterior auricular or occipital adenopathy.     Cervical: No cervical adenopathy.     Right cervical: No superficial, deep or posterior cervical adenopathy.    Left cervical: No superficial, deep or posterior cervical adenopathy.     Upper Body:     Right upper body: No supraclavicular or axillary adenopathy.     Left upper body: No supraclavicular or axillary adenopathy.      Lower Body: No right inguinal adenopathy. No left inguinal adenopathy.  Skin:    Coloration: Skin is not jaundiced or pale.  Neurological:     General: No focal deficit present.     Mental Status: He is alert and oriented to person, place, and time.     Comments: CN Normal except for decreased vision right eye  Psychiatric:        Mood and Affect: Mood normal.        Behavior: Behavior normal.        Thought Content: Thought content normal.        Judgment: Judgment normal.      LABORATORY DATA: I have personally reviewed the data as listed:  No visits with results within 1 Month(s) from this visit.  Latest known visit with results is:  Hospital Outpatient Visit on 01/26/2008  Component Date Value Ref Range Status   Color, Urine 01/26/2008 YELLOW   Final   APPearance 01/26/2008 CLEAR   Final   Specific Gravity, Urine 01/26/2008 1.007   Final   pH 01/26/2008 6.0   Final   Glucose, UA 01/26/2008 NEGATIVE   Final   Hgb urine dipstick 01/26/2008 NEGATIVE   Final   Bilirubin Urine 01/26/2008 NEGATIVE   Final   Ketones, ur 01/26/2008 NEGATIVE   Final   Protein, ur 01/26/2008 NEGATIVE   Final   Urobilinogen, UA 01/26/2008 0.2   Final   Nitrite 01/26/2008 NEGATIVE   Final   Leukocytes, UA 01/26/2008 NEGATIVE MICROSCOPIC NOT DONE ON URINES WITH NEGATIVE PROTEIN, BLOOD, LEUKOCYTES, NITRITE, OR GLUCOSE <1000 mg/dL.   Final   Sodium 01/26/2008 145   Final   Potassium 01/26/2008 3.2 (L)   Final   Chloride 01/26/2008 109   Final   CO2 01/26/2008 24   Final   Glucose, Bld 01/26/2008 90   Final   BUN 01/26/2008 7   Final   Creatinine, Ser 01/26/2008 0.95   Final   Calcium 01/26/2008 9.0  Final   GFR calc non Af Amer 01/26/2008 >60   Final   GFR calc Af Denyse Dago 01/26/2008    Final                   Value:>60                                The eGFR has been calculated                         using the MDRD equation.                         This calculation has not been                          validated in all clinical   WBC 01/26/2008 8.7   Final   RBC 01/26/2008 4.34   Final   Hemoglobin 01/26/2008 15.2   Final   HCT 01/26/2008 42.0   Final   MCV 01/26/2008 96.8   Final   MCHC 01/26/2008 36.1 (H)   Final   RDW 01/26/2008 13.9   Final   Platelets 01/26/2008 201   Final   Neutrophils Relative % 01/26/2008 48   Final   Neutro Abs 01/26/2008 4.1   Final   Lymphocytes Relative 01/26/2008 43   Final   Lymphs Abs 01/26/2008 3.7   Final   Monocytes Relative 01/26/2008 9   Final   Monocytes Absolute 01/26/2008 0.7   Final   Eosinophils Relative 01/26/2008 1   Final   Eosinophils Absolute 01/26/2008 0.0   Final   Basophils Relative 01/26/2008 1   Final   Basophils Absolute 01/26/2008 0.0   Final    RADIOGRAPHIC STUDIES: I have personally reviewed the radiological images as listed and agree with the findings in the report  No results found.  ASSESSMENT/PLAN  62 y.o. male is here because of history of colon cancer.  Medical history is notable for chronic respiratory failure with hypoxemia for which required supplemental oxygen, congestive heart failure, coronary artery disease with non-STEMI, diabetes mellitus type 2, hypertension, hyperlipidemia, osteoarthritis, history of heroin abuse, multiple pulmonary nodules, hepatitis B  History of colon cancer May 06 2023- Patient states was diagnosed with colon cancer somewhere between 1999 and 2001 after presenting with LLQ abdominal pain.   He states a colonoscopy was performed but was not completed because he experienced pain during the procedure.  A barium enema reportedly indicated that he had colon cancer.   He declined surgery because he was told that he might need a colostomy.  Patient was lost to follow up due to drug addiction and imprisonment.  He is now seeking medical attention because he is having LLQ abdominal pain, nausea, emesis and hematochezia.  There are no records available to substantiate patient's history.   Will obtain CBC with diff, CMP, CEA, CT CAP to evaluate  Hepatitis C May 06 2023- Obtain CMP, AFP, hepatitis panel, Hep C PCR         Cancer Staging  No matching staging information was found for the patient.    No problem-specific Assessment & Plan notes found for this encounter.    Orders Placed This Encounter  Procedures   CT CHEST ABDOMEN PELVIS W CONTRAST    Standing Status:   Future  Standing Expiration Date:   05/05/2024    Order Specific Question:   If indicated for the ordered procedure, I authorize the administration of contrast media per Radiology protocol    Answer:   Yes    Order Specific Question:   Does the patient have a contrast media/X-ray dye allergy?    Answer:   No    Order Specific Question:   Preferred imaging location?    Answer:   External    Order Specific Question:   If indicated for the ordered procedure, I authorize the administration of oral contrast media per Radiology protocol    Answer:   Yes   CBC with Differential/Platelet    Standing Status:   Future    Number of Occurrences:   1    Standing Expiration Date:   05/05/2024   Comprehensive metabolic panel    Standing Status:   Future    Number of Occurrences:   1    Standing Expiration Date:   05/05/2024   AFP tumor marker    Standing Status:   Future    Number of Occurrences:   1    Standing Expiration Date:   05/05/2024   CEA    Standing Status:   Future    Number of Occurrences:   1    Standing Expiration Date:   05/05/2024   Hepatitis panel, acute    Standing Status:   Future    Number of Occurrences:   1    Standing Expiration Date:   05/05/2024   HCV RNA quant    60  minutes was spent in patient care.  This included time spent preparing to see the patient (e.g., review of tests), obtaining and/or reviewing separately obtained history, counseling and educating the patient/family/caregiver, ordering medications, tests, or procedures; documenting clinical information in the  electronic or other health record, independently interpreting results and communicating results to the patient/family/caregiver as well as coordination of care.       All questions were answered. The patient knows to call the clinic with any problems, questions or concerns.  This note was electronically signed.    Loni Muse, MD  05/06/2023 11:43 AM

## 2023-05-07 LAB — HCV RNA QUANT
HCV Quantitative Log: 6.436 {Log} (ref >1.70)
HCV Quantitative: 2730000 [IU]/mL (ref >50)

## 2023-05-07 LAB — AFP TUMOR MARKER: AFP, Serum, Tumor Marker: 4.2 ng/mL (ref 0.0–8.4)

## 2023-05-07 LAB — CEA: CEA: 2.7 ng/mL (ref 0.0–4.7)

## 2023-05-09 DIAGNOSIS — E1159 Type 2 diabetes mellitus with other circulatory complications: Secondary | ICD-10-CM | POA: Diagnosis not present

## 2023-05-09 DIAGNOSIS — E1140 Type 2 diabetes mellitus with diabetic neuropathy, unspecified: Secondary | ICD-10-CM | POA: Diagnosis not present

## 2023-05-09 DIAGNOSIS — J9611 Chronic respiratory failure with hypoxia: Secondary | ICD-10-CM | POA: Diagnosis not present

## 2023-05-09 DIAGNOSIS — I255 Ischemic cardiomyopathy: Secondary | ICD-10-CM | POA: Diagnosis not present

## 2023-05-09 DIAGNOSIS — R7989 Other specified abnormal findings of blood chemistry: Secondary | ICD-10-CM | POA: Diagnosis not present

## 2023-05-09 DIAGNOSIS — I509 Heart failure, unspecified: Secondary | ICD-10-CM | POA: Diagnosis not present

## 2023-05-09 DIAGNOSIS — E7800 Pure hypercholesterolemia, unspecified: Secondary | ICD-10-CM | POA: Diagnosis not present

## 2023-05-09 DIAGNOSIS — I2089 Other forms of angina pectoris: Secondary | ICD-10-CM | POA: Diagnosis not present

## 2023-05-09 DIAGNOSIS — B1920 Unspecified viral hepatitis C without hepatic coma: Secondary | ICD-10-CM | POA: Diagnosis not present

## 2023-05-09 DIAGNOSIS — I10 Essential (primary) hypertension: Secondary | ICD-10-CM | POA: Diagnosis not present

## 2023-05-09 DIAGNOSIS — J449 Chronic obstructive pulmonary disease, unspecified: Secondary | ICD-10-CM | POA: Diagnosis not present

## 2023-05-09 DIAGNOSIS — I2510 Atherosclerotic heart disease of native coronary artery without angina pectoris: Secondary | ICD-10-CM | POA: Diagnosis not present

## 2023-05-09 NOTE — Progress Notes (Deleted)
Cardiology Office Note:  .   Date:  05/09/2023  ID:  Michael Ochoa, DOB 02-26-1961, MRN [id number] PCP: Patient, No Pcp Per  [facility] Providers Cardiologist:  None { Click to update primary MD,subspecialty MD or APP then REFRESH:1}   History of Present Illness: .   Michael Ochoa is a 62 y.o. male with a past medical history of CAD, colon cancer  Admitted to [facility] on 03/16/2023 for acute respiratory failure, heart failure exacerbation, after presenting with progressive worsening of shortness of breath.  Chest CT revealed cardiomegaly, bilateral pleural effusions.  He was started on [employer] treated with IV Lasix.  09/15/2022 stress evaluation reversible defects identified in the mid inferolateral, basal inferolateral, mid inferoseptal segments 09/12/2022 echo revealed an EF of 45 to 50%, mild MR, mildly sclerotic aortic valve with trace AR, trivial TR  ROS: ***  Studies Reviewed: .        *** Risk Assessment/Calculations:   {Does this patient have ATRIAL FIBRILLATION?:[phone]} No BP recorded.  {Refresh Note OR Click here to enter BP  :1}***       Physical Exam:   VS:  There were no vitals taken for this visit.   Wt Readings from Last 3 Encounters:  05/06/23 193 lb 12.8 oz (87.9 kg)  04/28/20 180 lb (81.6 kg)    GEN: Well nourished, well developed in no acute distress NECK: No JVD; No carotid bruits CARDIAC: ***RRR, no murmurs, rubs, gallops RESPIRATORY:  Clear to auscultation without rales, wheezing or rhonchi  ABDOMEN: Soft, non-tender, non-distended EXTREMITIES:  No edema; No deformity   ASSESSMENT AND PLAN: .   ***    {Are you ordering a CV Procedure (e.g. stress test, cath, DCCV, TEE, etc)?   Press F2        :[id number]}  Dispo: ***  Signed, Flossie Dibble, NP

## 2023-05-10 ENCOUNTER — Ambulatory Visit: Payer: 59 | Admitting: Cardiology

## 2023-05-14 ENCOUNTER — Telehealth: Payer: Self-pay | Admitting: Cardiology

## 2023-05-14 NOTE — Telephone Encounter (Signed)
Advise patient it has been years since we last saw him. We are unable to refill his meds until seen on 10/17. Patient aware in order to continue refill must have yearly check ups to ensure meds are working. Advised to patient may want to contact PCP for a small fill until seen here. Patient has already cancelled his appt twice.

## 2023-05-14 NOTE — Telephone Encounter (Signed)
*  STAT* If patient is at the pharmacy, call can be transferred to refill team.   1. Which medications need to be refilled? (please list name of each medication and dose if known)     atorvastatin (LIPITOR) 40 MG tablet Take 40 mg by mouth daily.   carvedilol (COREG) 6.25 MG tablet Take 1 tablet (6.25 mg total) by mouth 2 (two) times daily. Needs appointment for future refills / 1st attempt   ENTRESTO 24-26 MG Take 1 tablet by mouth 2 (two) times daily.   furosemide (LASIX) 20 MG tablet Take 20 mg by mouth daily.     4. Which pharmacy/location (including street and city if local pharmacy) is medication to be sent to? [id number] - [place], [place] - [address]     5. Do they need a 30 day or 90 day supply? 90

## 2023-05-16 ENCOUNTER — Ambulatory Visit: Payer: 59 | Attending: Cardiology | Admitting: Cardiology

## 2023-05-16 ENCOUNTER — Encounter: Payer: Self-pay | Admitting: Cardiology

## 2023-05-16 VITALS — BP 170/101 | HR 98 | Ht 69.5 in | Wt 193.0 lb

## 2023-05-16 DIAGNOSIS — J9601 Acute respiratory failure with hypoxia: Secondary | ICD-10-CM

## 2023-05-16 DIAGNOSIS — R739 Hyperglycemia, unspecified: Secondary | ICD-10-CM

## 2023-05-16 DIAGNOSIS — Z79899 Other long term (current) drug therapy: Secondary | ICD-10-CM

## 2023-05-16 DIAGNOSIS — I10 Essential (primary) hypertension: Secondary | ICD-10-CM

## 2023-05-16 DIAGNOSIS — Z5900 Homelessness unspecified: Secondary | ICD-10-CM | POA: Diagnosis not present

## 2023-05-16 DIAGNOSIS — I214 Non-ST elevation (NSTEMI) myocardial infarction: Secondary | ICD-10-CM

## 2023-05-16 MED ORDER — ATORVASTATIN CALCIUM 40 MG PO TABS: 40.0000 mg | ORAL_TABLET | Freq: Every day | ORAL | 5 refills | Status: AC

## 2023-05-16 MED ORDER — ENTRESTO 24-26 MG PO TABS: 1.0000 | ORAL_TABLET | Freq: Two times a day (BID) | ORAL | 5 refills | Status: DC

## 2023-05-16 MED ORDER — CARVEDILOL 6.25 MG PO TABS: 6.2500 mg | ORAL_TABLET | Freq: Two times a day (BID) | ORAL | 5 refills | Status: DC

## 2023-05-16 MED ORDER — ENTRESTO 24-26 MG PO TABS: 1.0000 | ORAL_TABLET | Freq: Two times a day (BID) | ORAL | Status: DC

## 2023-05-16 MED ORDER — FUROSEMIDE 20 MG PO TABS: 20.0000 mg | ORAL_TABLET | Freq: Every day | ORAL | 5 refills | Status: AC

## 2023-05-16 NOTE — Progress Notes (Signed)
Cardiology Office Note:  .   Date:  05/16/2023  ID:  Michael Ochoa, DOB Dec 09, 1960, MRN [id number] PCP: Simone Curia, MD  [facility] Providers Cardiologist:  Marlyn Corporal Madireddy, MD    History of Present Illness: .   Michael Ochoa is a 62 y.o. male with a past medical history of CAD, colon cancer, heart failure  09/15/2022 stress evaluation reversible defects identified in the mid inferolateral, basal inferolateral, mid inferoseptal segments 09/12/2022 echo revealed an EF of 45 to 50%, mild MR, mildly sclerotic aortic valve with trace AR, trivial TR  Admitted to [facility] on 03/16/2023 for acute respiratory failure, heart failure exacerbation, after presenting with progressive worsening of shortness of breath.  Chest CT revealed cardiomegaly, bilateral pleural effusions.  He was started on Entresto, Coreg, treated with IV Lasix.  He presents today accompanied by his fiance for follow-up of his heart failure.  He has been feeling very poorly over the last few days, recently ran out of his Entresto, Coreg.  Unfortunately he is homeless, there are lot of social constraints for him to navigate in order to maintain his health.  He has been feeling short of breath, reminiscent of when he was last admitted to the hospital with heart failure exacerbation.  He does have some pedal edema. He denies chest pain, palpitations,  pnd, orthopnea, n, v, dizziness, syncope, weight gain, or early satiety.   ROS: Review of Systems  Constitutional:  Positive for malaise/fatigue.  HENT: Negative.    Eyes: Negative.   Respiratory:  Positive for shortness of breath.   Cardiovascular:  Positive for leg swelling.  Gastrointestinal: Negative.   Genitourinary: Negative.   Musculoskeletal: Negative.   Skin: Negative.   Neurological: Negative.   Endo/Heme/Allergies: Negative.   Psychiatric/Behavioral: Negative.       Studies Reviewed: .        Cardiac Studies & Procedures     STRESS  TESTS  MYOCARDIAL PERFUSION IMAGING 09/15/2022              Risk Assessment/Calculations:          Physical Exam:   VS:  BP (!) 170/101   Pulse 98   Ht 5' 9.5" (1.765 m)   Wt 193 lb (87.5 kg)   SpO2 98%   BMI 28.09 kg/m    Wt Readings from Last 3 Encounters:  05/16/23 193 lb (87.5 kg)  05/06/23 193 lb 12.8 oz (87.9 kg)  04/28/20 180 lb (81.6 kg)    GEN: In no acute distress NECK: No JVD; No carotid bruits CARDIAC: RRR, no murmurs, rubs, gallops RESPIRATORY: Trace Rales noted in right lower lobe ABDOMEN: Soft, non-tender, non-distended EXTREMITIES: Trace pedal edema; No deformity   ASSESSMENT AND PLAN: .   HFmrEF -NYHA class II, slightly volume overloaded but in no acute distress.  He has been out of his [employer] for 4 days as well as his Coreg, states this is when his worsening shortness of breath started.  Will have him increase his Lasix to 40 mg p.o. x 3 days, then back to 20 mg daily.  Will provide him samples of Entresto 24-26 mg twice daily and he will resume this.  Will send refills in for his carvedilol.  Will check BMET and proBNP today.  CAD-no documents to disclose what type of coronary artery disease he has. Stable with no anginal symptoms. No indication for ischemic evaluation.  He is not on aspirin, will address at next OV.  Hypertension-blood pressure is very elevated  in the office today however has been out of his [employer] for several days.  He may be out of his amlodipine as well, he is not quite sure.  We will resume his Entresto 24-26 mg twice daily.  Homelessness-this is naturally contributing to his inability to obtain his medications.  He [place] with his fiance.  We will make a referral to our social work department to see if there is any possibility assistance for them.       Dispo: Increase Lasix to 40 mg p.o. x 3 days, then go back to 20 mg daily.  Resume Entresto 24-26 mg twice daily--we will provide him samples for this.   Resume carvedilol 6.25 mg twice daily.  BMET, proBNP today.  Repeat BMET in 1 week.  Signed, Flossie Dibble, NP

## 2023-05-16 NOTE — Patient Instructions (Signed)
Medication Instructions:  Your physician has recommended you make the following change in your medication:  Take Lasix 40 mg for 3 days and then 20 mg once daily  *If you need a refill on your cardiac medications before your next appointment, please call your pharmacy*   Lab Work: Your physician recommends that you return for lab work in: Today for BMP and ProBMP Come back in 1 week for a BMP Lab opens at 8am. You DO NOT NEED an appointment. Best time to come is between 8am and 12noon and between 1:30 and 4:30. If you have been asked to fast for your blood work please have nothing to eat or drink after midnight. You may have water.   If you have labs (blood work) drawn today and your tests are completely normal, you will receive your results only by: MyChart Message (if you have MyChart) OR A paper copy in the mail If you have any lab test that is abnormal or we need to change your treatment, we will call you to review the results.   Testing/Procedures: NONE   Follow-Up: At [facility], you and your health needs are our priority.  As part of our continuing mission to provide you with exceptional heart care, we have created designated Provider Care Teams.  These Care Teams include your primary Cardiologist (physician) and Advanced Practice Providers (APPs -  Physician Assistants and Nurse Practitioners) who all work together to provide you with the care you need, when you need it.  We recommend signing up for the patient portal called "MyChart".  Sign up information is provided on this After Visit Summary.  MyChart is used to connect with patients for Virtual Visits (Telemedicine).  Patients are able to view lab/test results, encounter notes, upcoming appointments, etc.  Non-urgent messages can be sent to your provider as well.   To learn more about what you can do with MyChart, go to [email or web address].    Your next appointment:   6 week(s)  Provider:   Wallis Bamberg,  NP Rosalita Levan)    Other Instructions

## 2023-05-17 ENCOUNTER — Telehealth: Payer: Self-pay | Admitting: *Deleted

## 2023-05-17 ENCOUNTER — Telehealth (HOSPITAL_COMMUNITY): Payer: Self-pay | Admitting: Licensed Clinical Social Worker

## 2023-05-17 DIAGNOSIS — I10 Essential (primary) hypertension: Secondary | ICD-10-CM

## 2023-05-17 DIAGNOSIS — Z79899 Other long term (current) drug therapy: Secondary | ICD-10-CM

## 2023-05-17 DIAGNOSIS — J9601 Acute respiratory failure with hypoxia: Secondary | ICD-10-CM

## 2023-05-17 LAB — BASIC METABOLIC PANEL WITH GFR
BUN/Creatinine Ratio: 15 (ref 10–24)
BUN: 12 mg/dL (ref 8–27)
CO2: 23 mmol/L (ref 20–29)
Calcium: 9.3 mg/dL (ref 8.6–10.2)
Chloride: 99 mmol/L (ref 96–106)
Creatinine, Ser: 0.78 mg/dL (ref 0.76–1.27)
Glucose: 126 mg/dL — ABNORMAL HIGH (ref 70–99)
Potassium: 4 mmol/L (ref 3.5–5.2)
Sodium: 137 mmol/L (ref 134–144)
eGFR: 101 mL/min/1.73

## 2023-05-17 LAB — PRO B NATRIURETIC PEPTIDE: NT-Pro BNP: 12071 pg/mL — ABNORMAL HIGH (ref 0–210)

## 2023-05-17 NOTE — Telephone Encounter (Signed)
Informed pt of results and put in order for extra test (ProBNP). Pt verbalized understanding and had no further questions.

## 2023-05-17 NOTE — Telephone Encounter (Signed)
-----   Message from Flossie Dibble sent at 05/17/2023  7:47 AM EDT ----- Lab work indicates that you are holding on to too much fluid, this is why you feel so poorly.  Continue with the plan we discussed yesterday.  If you do not feel better after starting your medications in a day or 2, you may need to go to the emergency department for further treatment.  He is coming back next week for BMP, could we add a proBNP to that as well please.

## 2023-05-17 NOTE — Progress Notes (Signed)
Heart and Vascular Care Navigation  05/17/2023  Michael Ochoa [birth year] [id number]  Reason for Referral: financial concerns, homelessness concerns  Patient is participating in a Managed Medicaid Plan:Yes  Engaged with patient by telephone for initial visit for Heart and Vascular Care Coordination.                                                                                                   Assessment:   CSW called pt to discuss above concerns.  Pt reports that he has [place] bridge with his fiance for the past several nights.  Had been staying with an aunt but reports there are issues with substance abuse in that house and he has a h/o of addiction so could not stay safely in that environment- has no one else he can stay with at this time.  Patient and fiance both get disability/retirement- total $2,300 together.  They are out of money for this month however as they spent it to purchase a scooter so they could get around when they needed to.    CSW discussed shelter but no male/male shelters in their area and don't want to separate.  CSW spoke with cancer center social worker as patient was recently seen in [place] for colon cancer follow up- will work with their team as appropriate for this patient.  CSW reached out to local Coordinated entry contact but no response as of yet.  Will continue to reach out to resources and assist as able- will plan to follow up with patient next week regarding options.                                 HRT/VAS Care Coordination     Patients Home Cardiology Office [facility]   Outpatient Care Team Social Worker   Social Worker Name: Rosetta Posner, Heart and Vascular, [phone]   Living arrangements for the past 2 months Homeless   Lives with: Significant Other   Patient Current Insurance Coverage Medicaid   Patient Has Concern With Paying Medical Bills No   Does Patient Have Prescription Coverage? Yes       Social History:                                                                              SDOH Screenings   Food Insecurity: No Food Insecurity (05/06/2023)  Housing: Medium Risk (05/17/2023)  Transportation Needs: No Transportation Needs (05/06/2023)  Utilities: Not At Risk (05/06/2023)  Depression (PHQ2-9): Low Risk  (05/06/2023)  Financial Resource Strain: High Risk (05/17/2023)  Tobacco Use: High Risk (05/16/2023)    SDOH Interventions: Financial Resources:    Receives $943 from disability and fiance gets about $1,300  Food Insecurity:  Recertifying foodstamps  Housing Insecurity:  Housing Interventions: Other (Comment)  Transportation:   Has a scooter   Follow-up plan:    CSW will continue to follow and assist as needed  Burna Sis, LCSW Clinical Social Worker [facility] Desk#: [phone] Cell#: [phone]

## 2023-05-20 ENCOUNTER — Other Ambulatory Visit: Payer: 59

## 2023-05-20 ENCOUNTER — Ambulatory Visit: Payer: 59 | Admitting: Oncology

## 2023-05-20 ENCOUNTER — Telehealth (HOSPITAL_COMMUNITY): Payer: Self-pay | Admitting: Licensed Clinical Social Worker

## 2023-05-20 NOTE — Telephone Encounter (Signed)
H&V Care Navigation CSW Progress Note  Clinical Social Worker called pt to follow up on housing.  Patient has continued to stay outdoors with his fiance.  Will get social security check on Nov. 1st and can pay for hotel then.  CSW called [employer] and requested case manager to reach out regarding housing as they have paid for hotel for members in the past- hopeful they can help bridge patient to Nov. 1st.  CSW sent patient list of local housing options to evaluate.  Patient is participating in a Managed Medicaid Plan:  Yes  SDOH Screenings   Food Insecurity: No Food Insecurity (05/06/2023)  Housing: Medium Risk (05/17/2023)  Transportation Needs: No Transportation Needs (05/06/2023)  Utilities: Not At Risk (05/06/2023)  Depression (PHQ2-9): Low Risk  (05/06/2023)  Financial Resource Strain: High Risk (05/17/2023)  Tobacco Use: High Risk (05/16/2023)   Burna Sis, LCSW Clinical Social Worker [facility] Desk#: [phone] Cell#: [phone]

## 2023-05-23 ENCOUNTER — Inpatient Hospital Stay: Admission: RE | Admit: 2023-05-23 | Payer: 59 | Source: Ambulatory Visit

## 2023-05-27 ENCOUNTER — Inpatient Hospital Stay: Payer: 59 | Admitting: Oncology

## 2023-05-27 ENCOUNTER — Inpatient Hospital Stay: Payer: 59

## 2023-05-27 NOTE — Progress Notes (Deleted)
[facility] Cancer Initial Visit:  Patient Care Team: Simone Curia, MD as PCP - General (Internal Medicine) Madireddy, Marlyn Corporal, MD as PCP - Cardiology (Cardiology)  CHIEF COMPLAINTS/PURPOSE OF CONSULTATION:  Oncology History  Colon cancer [facility])  05/06/2023 Initial Diagnosis   Colon cancer (HCC)     HISTORY OF PRESENTING ILLNESS: Michael Ochoa 62 y.o. male is here because of colon cancer Medical history is notable for chronic respiratory failure with hypoxemia for which required supplemental oxygen, congestive heart failure, coronary artery disease with non-STEMI, diabetes mellitus type 2, hypertension, hyperlipidemia, osteoarthritis, history of heroin abuse, multiple pulmonary nodules, hepatitis B  May 06 2023:  [facility] Consult Patient states that he was diagnosed with colon cancer somewhere between 1999 and 2001 while living in [place], [place] after presenting with LLQ abdominal pain.   He states that a colonoscopy was performed but could not be completed because he experienced pain during the procedure.  He subsequently underwent a barium enema which patient states indicated that he had colon cancer.   He states that he never had surgery because he was told that he might need a colostomy.  Patient was lost to follow up due to drug addiction and imprisonment.  He states that he is seeking medical attention now because he is having LLQ abdominal pain, nausea, emesis and hematochezia.      Social:  Disabled.  Tobacco began 50 years ago up to 1 1/2 ppd now down to 1/2 ppd.  EtOH not currently but drank heavily in the past.  Years ago was addicted to heroin but tried a bit of everything.  Now smokes pot and takes suboxone  [facility] Mother alive 80 brain aneurysm.   Father died 31 heart disease Brother alive 34 heart disease   Review of Systems  Constitutional:  Positive for appetite change. Negative for chills, fatigue and fever.       Has lost about 15 lbs in  the past 3 months  HENT:   Negative for mouth sores, nosebleeds, sore throat, tinnitus, trouble swallowing and voice change.   Eyes:  Negative for icterus.       Decreased vision right eye  Respiratory:  Positive for cough. Negative for chest tightness, hemoptysis and wheezing.        DOE walking short distances  Cardiovascular:  Positive for leg swelling and palpitations. Negative for chest pain.       PND:  none Orthopnea:  none  Gastrointestinal:  Positive for abdominal pain, blood in stool, nausea, rectal pain and vomiting. Negative for constipation and diarrhea.  Endocrine: Negative for hot flashes.       Cold intolerance:  none Heat intolerance:  none  Genitourinary:  Negative for bladder incontinence, difficulty urinating, dysuria, frequency, hematuria and nocturia.   Musculoskeletal:  Negative for arthralgias, back pain, myalgias, neck pain and neck stiffness.  Skin:  Negative for itching, rash and wound.  Neurological:  Negative for numbness.       Occasional dizziness and HA's.  Gait problems due to fasciotomy left calf  Hematological:  Negative for adenopathy. Does not bruise/bleed easily.  Psychiatric/Behavioral:  Positive for sleep disturbance. Negative for suicidal ideas. The patient is not nervous/anxious.     MEDICAL HISTORY: Past Medical History:  Diagnosis Date  . Acute respiratory failure with hypoxia (HCC)   . CAD (coronary artery disease)   . CHF (congestive heart failure) (HCC)   . Chronic pulmonary edema   . Colon cancer (HCC)   .  COPD (chronic obstructive pulmonary disease) (HCC)   . Diabetes mellitus without complication (HCC)   . Essential (primary) hypertension   . Heart failure, unspecified (HCC)   . Hyperglycemia, unspecified   . Hyperlipidemia   . Nicotine dependence, unspecified, uncomplicated   . Non-ST elevation myocardial infarction (NSTEMI) (HCC)   . Other psychoactive substance abuse, uncomplicated (HCC)     SURGICAL HISTORY: Past Surgical  History:  Procedure Laterality Date  . COLON RESECTION    . COLONOSCOPY    . KNEE SURGERY Left     SOCIAL HISTORY: Social History   Socioeconomic History  . Marital status: Single    Spouse name: Not on file  . Number of children: Not on file  . Years of education: Not on file  . Highest education level: Not on file  Occupational History  . Not on file  Tobacco Use  . Smoking status: Every Day    Types: Cigarettes  . Smokeless tobacco: Never  Vaping Use  . Vaping status: Never Used  Substance and Sexual Activity  . Alcohol use: Not Currently  . Drug use: Yes    Types: Marijuana, Codeine, Heroin, Cocaine    Comment: Only using marijuana  . Sexual activity: Not on file  Other Topics Concern  . Not on file  Social History Narrative  . Not on file   Social Determinants of Health   Financial Resource Strain: High Risk (05/17/2023)   Overall Financial Resource Strain (CARDIA)   . Difficulty of Paying Living Expenses: Very hard  Food Insecurity: No Food Insecurity (05/06/2023)   Hunger Vital Sign   . Worried About Programme researcher, broadcasting/film/video in the Last Year: Never true   . Ran Out of Food in the Last Year: Never true  Transportation Needs: No Transportation Needs (05/06/2023)   PRAPARE - Transportation   . Lack of Transportation (Medical): No   . Lack of Transportation (Non-Medical): No  Physical Activity: Not on file  Stress: Not on file  Social Connections: Not on file  Intimate Partner Violence: Not At Risk (05/06/2023)   Humiliation, Afraid, Rape, and Kick questionnaire   . Fear of Current or Ex-Partner: No   . Emotionally Abused: No   . Physically Abused: No   . Sexually Abused: No    FAMILY HISTORY Family History  Problem Relation Age of Onset  . Heart disease Father   . Heart disease Brother     ALLERGIES:  is allergic to codeine and penicillins.  MEDICATIONS:  Current Outpatient Medications  Medication Sig Dispense Refill  . amLODipine (NORVASC) 5 MG  tablet Take 5 mg by mouth daily.    Marland Kitchen atorvastatin (LIPITOR) 40 MG tablet Take 1 tablet (40 mg total) by mouth daily. 30 tablet 5  . carvedilol (COREG) 6.25 MG tablet Take 1 tablet (6.25 mg total) by mouth 2 (two) times daily. 60 tablet 5  . ENTRESTO 24-26 MG Take 1 tablet by mouth 2 (two) times daily. 60 tablet 5  . furosemide (LASIX) 20 MG tablet Take 1 tablet (20 mg total) by mouth daily. 30 tablet 5  . gabapentin (NEURONTIN) 800 MG tablet Take 800 mg by mouth 3 (three) times daily.    . SUBOXONE 8-2 MG FILM Place 1 Film under the tongue 3 (three) times daily.     No current facility-administered medications for this visit.    PHYSICAL EXAMINATION:  ECOG PERFORMANCE STATUS: 2 - Symptomatic, <50% confined to bed   There were no vitals filed for  this visit.   There were no vitals filed for this visit.    Physical Exam Vitals and nursing note reviewed.  Constitutional:      General: He is not in acute distress.    Appearance: Normal appearance. He is not diaphoretic.     Comments: Here with fiancee.  Unkempt  HENT:     Head: Normocephalic and atraumatic.     Right Ear: External ear normal.     Left Ear: External ear normal.     Nose: Nose normal.  Eyes:     General: No scleral icterus.    Conjunctiva/sclera: Conjunctivae normal.     Pupils: Pupils are equal, round, and reactive to light.  Cardiovascular:     Rate and Rhythm: Normal rate and regular rhythm.     Heart sounds: Normal heart sounds. No murmur heard.    No friction rub. No gallop.  Pulmonary:     Effort: Pulmonary effort is normal. No respiratory distress.     Breath sounds: Normal breath sounds. No stridor.  Abdominal:     General: There is no distension.     Palpations: Abdomen is soft.     Tenderness: There is no abdominal tenderness. There is no guarding or rebound.  Musculoskeletal:        General: No swelling or tenderness.     Cervical back: Normal range of motion and neck supple. No rigidity or  tenderness.     Right lower leg: No edema.     Left lower leg: No edema.  Lymphadenopathy:     Head:     Right side of head: No submental, submandibular, tonsillar, preauricular, posterior auricular or occipital adenopathy.     Left side of head: No submental, submandibular, tonsillar, preauricular, posterior auricular or occipital adenopathy.     Cervical: No cervical adenopathy.     Right cervical: No superficial, deep or posterior cervical adenopathy.    Left cervical: No superficial, deep or posterior cervical adenopathy.     Upper Body:     Right upper body: No supraclavicular or axillary adenopathy.     Left upper body: No supraclavicular or axillary adenopathy.     Lower Body: No right inguinal adenopathy. No left inguinal adenopathy.  Skin:    Coloration: Skin is not jaundiced or pale.  Neurological:     General: No focal deficit present.     Mental Status: He is alert and oriented to person, place, and time.     Comments: CN Normal except for decreased vision right eye  Psychiatric:        Mood and Affect: Mood normal.        Behavior: Behavior normal.        Thought Content: Thought content normal.        Judgment: Judgment normal.     LABORATORY DATA: I have personally reviewed the data as listed:  Office Visit on 05/16/2023  Component Date Value Ref Range Status  . Glucose 05/16/2023 126 (H)  70 - 99 mg/dL Final  . BUN 11/91/4782 12  8 - 27 mg/dL Final  . Creatinine, Ser 05/16/2023 0.78  0.76 - 1.27 mg/dL Final  . eGFR 95/62/1308 101  >59 mL/min/1.73 Final  . BUN/Creatinine Ratio 05/16/2023 15  10 - 24 Final  . Sodium 05/16/2023 137  134 - 144 mmol/L Final  . Potassium 05/16/2023 4.0  3.5 - 5.2 mmol/L Final  . Chloride 05/16/2023 99  96 - 106 mmol/L Final  . CO2 05/16/2023  23  20 - 29 mmol/L Final  . Calcium 05/16/2023 9.3  8.6 - 10.2 mg/dL Final  . NT-Pro BNP 40/98/1191 12,071 (H)  0 - 210 pg/mL Final   Comment: The following cut-points have been suggested for  the use of proBNP for the diagnostic evaluation of heart failure (HF) in patients with acute dyspnea: Modality                     Age           Optimal Cut                            (years)            Point ------------------------------------------------------ Diagnosis (rule in HF)        <50            450 pg/mL                           50 - 75            900 pg/mL                               >75           1800 pg/mL Exclusion (rule out HF)  Age independent     300 pg/mL   Appointment on 05/06/2023  Component Date Value Ref Range Status  . Hepatitis B Surface Ag 05/06/2023 NON REACTIVE  NON REACTIVE Final  . HCV Ab 05/06/2023 Reactive (A)  NON REACTIVE Final   Comment: (NOTE) The CDC recommends that a Reactive HCV antibody result be followed up  with a HCV Nucleic Acid Amplification test.    . Hep A IgM 05/06/2023 NON REACTIVE  NON REACTIVE Final  . Hep B C IgM 05/06/2023 NON REACTIVE  NON REACTIVE Final   Performed at [facility], [address] [place], [place] [address]  . CEA 05/06/2023 2.7  0.0 - 4.7 ng/mL Final   Comment: (NOTE)                             Nonsmokers          <3.9                             Smokers             <5.6 Roche [employer] Electrochemiluminescence Immunoassay (ECLIA) Values obtained with different assay methods or kits cannot be used interchangeably.  Results cannot be interpreted as absolute evidence of the presence or absence of malignant disease. Performed At: [facility] [address] [place], [place] [id number] Jolene Schimke MD HQ:[id number]   . AFP, Serum, Tumor Marker 05/06/2023 4.2  0.0 - 8.4 ng/mL Final   Comment: (NOTE) Roche [employer] Electrochemiluminescence Immunoassay (ECLIA) Values obtained with different assay methods or kits cannot be used interchangeably.  Results cannot be interpreted as absolute evidence of the presence or absence of malignant disease. This test is not interpretable in  pregnant females. Performed At: [facility] [address] [place], [place] [id number] Jolene Schimke MD VO:[id number]   . Sodium 05/06/2023 138  135 - 145 mmol/L Final  . Potassium 05/06/2023 4.4  3.5 - 5.1  mmol/L Final  . Chloride 05/06/2023 105  98 - 111 mmol/L Final  . CO2 05/06/2023 25  22 - 32 mmol/L Final  . Glucose, Bld 05/06/2023 118 (H)  70 - 99 mg/dL Final   Glucose reference range applies only to samples taken after fasting for at least 8 hours.  . BUN 05/06/2023 18  8 - 23 mg/dL Final  . Creatinine, Ser 05/06/2023 0.77  0.61 - 1.24 mg/dL Final  . Calcium 40/98/1191 8.7 (L)  8.9 - 10.3 mg/dL Final  . Total Protein 05/06/2023 7.2  6.5 - 8.1 g/dL Final  . Albumin 47/82/9562 3.8  3.5 - 5.0 g/dL Final  . AST 13/02/6577 37  15 - 41 U/L Final  . ALT 05/06/2023 39  0 - 44 U/L Final  . Alkaline Phosphatase 05/06/2023 107  38 - 126 U/L Final  . Total Bilirubin 05/06/2023 1.0  0.3 - 1.2 mg/dL Final  . GFR, Estimated 05/06/2023 >60  >60 mL/min Final   Comment: (NOTE) Calculated using the CKD-EPI Creatinine Equation (2021)   . Anion gap 05/06/2023 8  5 - 15 Final   Performed at [facility], [address] [place], [place] [address]  . WBC 05/06/2023 8.0  4.0 - 10.5 K/uL Final  . RBC 05/06/2023 4.63  4.22 - 5.81 MIL/uL Final  . Hemoglobin 05/06/2023 14.3  13.0 - 17.0 g/dL Final  . HCT 95/28/4132 43.7  39.0 - 52.0 % Final  . MCV 05/06/2023 94.4  80.0 - 100.0 fL Final  . MCH 05/06/2023 30.9  26.0 - 34.0 pg Final  . MCHC 05/06/2023 32.7  30.0 - 36.0 g/dL Final  . RDW 44/07/270 15.0  11.5 - 15.5 % Final  . Platelets 05/06/2023 192  150 - 400 K/uL Final  . nRBC 05/06/2023 0.0  0.0 - 0.2 % Final  . Neutrophils Relative % 05/06/2023 65  % Final  . Neutro Abs 05/06/2023 5.3  1.7 - 7.7 K/uL Final  . Lymphocytes Relative 05/06/2023 24  % Final  . Lymphs Abs 05/06/2023 1.9  0.7 - 4.0 K/uL Final  . Monocytes Relative 05/06/2023 9  % Final  . Monocytes  Absolute 05/06/2023 0.7  0.1 - 1.0 K/uL Final  . Eosinophils Relative 05/06/2023 1  % Final  . Eosinophils Absolute 05/06/2023 0.1  0.0 - 0.5 K/uL Final  . Basophils Relative 05/06/2023 0  % Final  . Basophils Absolute 05/06/2023 0.0  0.0 - 0.1 K/uL Final  . Immature Granulocytes 05/06/2023 1  % Final  . Abs Immature Granulocytes 05/06/2023 0.04  0.00 - 0.07 K/uL Final   Performed at [facility], [address] [place], [place] [address]  Office Visit on 05/06/2023  Component Date Value Ref Range Status  . HCV Quantitative 05/06/2023 2,730,000  >50 IU/mL Final  . HCV Quantitative Log 05/06/2023 6.436  >1.70 log10 IU/mL Final  . Test Information 05/06/2023 Comment   Final   Comment: (NOTE) The quantitative range of this assay is 15 IU/mL to 100 million IU/mL. Performed At: [facility] [address] [place], [place] [id number] Jolene Schimke MD DG:[id number]     RADIOGRAPHIC STUDIES: I have personally reviewed the radiological images as listed and agree with the findings in the report  No results found.  ASSESSMENT/PLAN  62 y.o. male is here because of history of colon cancer.  Medical history is notable for chronic respiratory failure with hypoxemia for which required supplemental oxygen, congestive heart failure, coronary artery disease with non-STEMI,  diabetes mellitus type 2, hypertension, hyperlipidemia, osteoarthritis, history of heroin abuse, multiple pulmonary nodules, hepatitis B  History of colon cancer May 06 2023- Patient states was diagnosed with colon cancer somewhere between 1999 and 2001 after presenting with LLQ abdominal pain.   He states a colonoscopy was performed but was not completed because he experienced pain during the procedure.  A barium enema reportedly indicated that he had colon cancer.   He declined surgery because he was told that he might need a colostomy.  Patient was lost to follow up due to drug addiction and  imprisonment.  He is now seeking medical attention because he is having LLQ abdominal pain, nausea, emesis and hematochezia.  There are no records available to substantiate patient's history.  Will obtain CBC with diff, CMP, CEA, CT CAP to evaluate  Hepatitis C May 06 2023- Obtain CMP, AFP, hepatitis panel, Hep C PCR         Cancer Staging  No matching staging information was found for the patient.    No problem-specific Assessment & Plan notes found for this encounter.    No orders of the defined types were placed in this encounter.   60  minutes was spent in patient care.  This included time spent preparing to see the patient (e.g., review of tests), obtaining and/or reviewing separately obtained history, counseling and educating the patient/family/caregiver, ordering medications, tests, or procedures; documenting clinical information in the electronic or other health record, independently interpreting results and communicating results to the patient/family/caregiver as well as coordination of care.       All questions were answered. The patient knows to call the clinic with any problems, questions or concerns.  This note was electronically signed.    Loni Muse, MD  05/27/2023 8:42 AM

## 2023-05-31 ENCOUNTER — Telehealth (HOSPITAL_COMMUNITY): Payer: Self-pay | Admitting: Licensed Clinical Social Worker

## 2023-05-31 NOTE — Telephone Encounter (Signed)
H&V Care Navigation CSW Progress Note  Clinical Social Worker spoke with pt regarding hotel options.  Unfortunately [facility] has no availability- patient will try again tomorrow.  Also spoke with [facility] hotel that would be within budget they might come here tomorrow if [facility] continues not to have options.  CSW continuing to follow and support in finding more permanent housing options.  SDOH Screenings   Food Insecurity: No Food Insecurity (05/06/2023)  Housing: Medium Risk (05/17/2023)  Transportation Needs: No Transportation Needs (05/06/2023)  Utilities: Not At Risk (05/06/2023)  Depression (PHQ2-9): Low Risk  (05/06/2023)  Financial Resource Strain: High Risk (05/17/2023)  Tobacco Use: High Risk (05/16/2023)   Burna Sis, LCSW Clinical Social Worker [facility] Desk#: [phone] Cell#: [phone]

## 2023-06-13 ENCOUNTER — Other Ambulatory Visit: Payer: 59

## 2023-06-26 DIAGNOSIS — E785 Hyperlipidemia, unspecified: Secondary | ICD-10-CM | POA: Insufficient documentation

## 2023-06-26 DIAGNOSIS — E119 Type 2 diabetes mellitus without complications: Secondary | ICD-10-CM | POA: Insufficient documentation

## 2023-06-26 DIAGNOSIS — I509 Heart failure, unspecified: Secondary | ICD-10-CM | POA: Insufficient documentation

## 2023-06-26 DIAGNOSIS — I2510 Atherosclerotic heart disease of native coronary artery without angina pectoris: Secondary | ICD-10-CM | POA: Insufficient documentation

## 2023-06-26 DIAGNOSIS — J811 Chronic pulmonary edema: Secondary | ICD-10-CM | POA: Insufficient documentation

## 2023-06-26 DIAGNOSIS — J449 Chronic obstructive pulmonary disease, unspecified: Secondary | ICD-10-CM | POA: Insufficient documentation

## 2023-06-30 NOTE — Progress Notes (Signed)
 " Cardiology Office Note:  .   Date:  07/01/2023  ID:  Michael Ochoa, DOB 04-13-1961, MRN [id number] PCP: Jama Chow, MD  [facility] Providers Cardiologist:  Alean SAUNDERS Madireddy, MD    History of Present Illness: .   Michael Ochoa is a 62 y.o. male with a past medical history of CAD, colon cancer, HFmrEF, homeless.   09/15/2022 stress evaluation reversible defects identified in the mid inferolateral, basal inferolateral, mid inferoseptal segments 09/12/2022 echo revealed an EF of 45 to 50%, mild MR, mildly sclerotic aortic valve with trace AR, trivial TR  Admitted to [facility] on 03/16/2023 for acute respiratory failure, heart failure exacerbation, after presenting with progressive worsening of shortness of breath.  Chest CT revealed cardiomegaly, bilateral pleural effusions.  He was started on Entresto , Coreg , treated with IV Lasix .  Evaluated 05/16/2023, he was in decompensated heart failure, been out of several of his medications.  Main issue is homelessness and psychosocial constraints, we referred him to our social worker who was able to help him with temporary housing.  He presents today for follow-up of his heart failure.  He has been doing well, currently able to afford all of his medications.  He does have some pedal edema however it is better than normal, he is still bothered by breathlessness at times but overall states he is feeling much better. He denies chest pain, palpitations, dyspnea, pnd, orthopnea, n, v, dizziness, syncope, weight gain, or early satiety.    ROS: Review of Systems  Constitutional:  Positive for malaise/fatigue.  HENT: Negative.    Eyes: Negative.   Respiratory:  Positive for shortness of breath.   Cardiovascular:  Positive for leg swelling.  Gastrointestinal: Negative.   Genitourinary: Negative.   Musculoskeletal: Negative.   Skin: Negative.   Neurological: Negative.   Endo/Heme/Allergies: Negative.   Psychiatric/Behavioral: Negative.        Studies Reviewed: .        Cardiac Studies & Procedures     STRESS TESTS  MYOCARDIAL PERFUSION IMAGING 09/15/2022              Risk Assessment/Calculations:          Physical Exam:   VS:  BP (!) 169/90 (BP Location: Left Arm, Patient Position: Sitting, Cuff Size: Normal)   Pulse 69   Ht 5' 9.5 (1.765 m)   Wt 194 lb (88 kg)   SpO2 97%   BMI 28.24 kg/m    Wt Readings from Last 3 Encounters:  07/01/23 194 lb (88 kg)  05/16/23 193 lb (87.5 kg)  05/06/23 193 lb 12.8 oz (87.9 kg)    GEN: In no acute distress NECK: No JVD; No carotid bruits CARDIAC: RRR, no murmurs, rubs, gallops RESPIRATORY: Trace Rales noted in right lower lobe ABDOMEN: Soft, non-tender, non-distended EXTREMITIES: Trace pedal edema; No deformity   ASSESSMENT AND PLAN: .   HFmrEF -NYHA class II, euvolemic.  Continue Entresto  but we will increase his dose to 49-51 mg twice daily--most recent creatinine on 05/16/2023 was 0.78, potassium 4.0, continue Coreg  6.25 mg twice daily.  Will consider starting him on SGLT2 inhibitor at next OV, finances are very tight for him and his Social Security check was just cut by $300.   CAD-no documents to disclose what type of coronary artery disease he has. Stable with no anginal symptoms. No indication for ischemic evaluation.  He is not on aspirin, will address at next OV.  Hypertension-blood pressure is elevated in the office today at 158/82,  we are going to increase his Entresto  to 49-51 mg twice daily.  Homelessness-our child psychotherapist who is working with him and has helped obtain temporary housing.  Tobacco abuse-he is trying to cut back, currently smoking a little bit less than 1 PPD, he does want to quit but he is just not motivated to do so right now.       Dispo: Increase Entresto  to 49 to 51 mg twice daily, repeat BMET in 2 weeks.  Follow-up in 3 months with me.  Signed, Delon JAYSON Hoover, NP  "

## 2023-07-01 ENCOUNTER — Ambulatory Visit: Payer: 59 | Attending: Cardiology | Admitting: Cardiology

## 2023-07-01 ENCOUNTER — Encounter: Payer: Self-pay | Admitting: Cardiology

## 2023-07-01 VITALS — BP 158/82 | HR 69 | Ht 69.5 in | Wt 194.0 lb

## 2023-07-01 DIAGNOSIS — I2510 Atherosclerotic heart disease of native coronary artery without angina pectoris: Secondary | ICD-10-CM

## 2023-07-01 DIAGNOSIS — I509 Heart failure, unspecified: Secondary | ICD-10-CM

## 2023-07-01 DIAGNOSIS — Z720 Tobacco use: Secondary | ICD-10-CM

## 2023-07-01 DIAGNOSIS — Z5900 Homelessness unspecified: Secondary | ICD-10-CM | POA: Diagnosis not present

## 2023-07-01 DIAGNOSIS — I10 Essential (primary) hypertension: Secondary | ICD-10-CM | POA: Diagnosis not present

## 2023-07-01 MED ORDER — ENTRESTO 49-51 MG PO TABS: 1.0000 | ORAL_TABLET | Freq: Two times a day (BID) | ORAL | 1 refills | Status: DC

## 2023-07-01 NOTE — Patient Instructions (Signed)
Medication Instructions:   Your physician has recommended you make the following change in your medication:   Increase Entresto to 49-51 mg twice daily. Take two of your current entresto in the morning  and take two in the evening until you run out. When you pick up your next prescription at the pharmacy you will only take one in the morning and one in evening. .   *If you need a refill on your cardiac medications before your next appointment, please call your pharmacy*   Lab Work: You will come back the week of December 16th- December 20th to have a BMP done.  If you have labs (blood work) drawn today and your tests are completely normal, you will receive your results only by: MyChart Message (if you have MyChart) OR A paper copy in the mail If you have any lab test that is abnormal or we need to change your treatment, we will call you to review the results.   Testing/Procedures: None ordered   Follow-Up: At [facility], you and your health needs are our priority.  As part of our continuing mission to provide you with exceptional heart care, we have created designated Provider Care Teams.  These Care Teams include your primary Cardiologist (physician) and Advanced Practice Providers (APPs -  Physician Assistants and Nurse Practitioners) who all work together to provide you with the care you need, when you need it.  We recommend signing up for the patient portal called "MyChart".  Sign up information is provided on this After Visit Summary.  MyChart is used to connect with patients for Virtual Visits (Telemedicine).  Patients are able to view lab/test results, encounter notes, upcoming appointments, etc.  Non-urgent messages can be sent to your provider as well.   To learn more about what you can do with MyChart, go to [email or web address].    Your next appointment:   3 month(s)  Provider:   Wallis Bamberg, NP Rosalita Levan)

## 2023-09-29 NOTE — Progress Notes (Deleted)
 Cardiology Office Note:  .   Date:  09/29/2023  ID:  [name], DOB May 23, 1961, MRN [id number] PCP: Simone Curia, MD  [facility] Providers Cardiologist:  Marlyn Corporal Madireddy, MD    History of Present Illness: .   Michael Ochoa is a 63 y.o. male with a past medical history of CAD, colon cancer, HFmrEF, homeless.   09/15/2022 stress evaluation reversible defects identified in the mid inferolateral, basal inferolateral, mid inferoseptal segments 09/12/2022 echo revealed an EF of 45 to 50%, mild MR, mildly sclerotic aortic valve with trace AR, trivial TR  Admitted to [facility] on 03/16/2023 for acute respiratory failure, heart failure exacerbation, after presenting with progressive worsening of shortness of breath.  Chest CT revealed cardiomegaly, bilateral pleural effusions.  He was started on Entresto, Coreg, treated with IV Lasix.  Evaluated 05/16/2023, he was in decompensated heart failure, been out of several of his medications.  Main issue is homelessness and psychosocial constraints, we referred him to our social worker who was able to help him with temporary housing.  He presents today for follow-up of his heart failure.  He has been doing well, currently able to afford all of his medications.  He does have some pedal edema however it is better than normal, he is still bothered by breathlessness at times but overall states he is feeling much better. He denies chest pain, palpitations, dyspnea, pnd, orthopnea, n, v, dizziness, syncope, weight gain, or early satiety.   Dispo: Increase Entresto to 49 to 51 mg twice daily, repeat BMET in 2 weeks.  Follow-up in 3 months with me.   ROS: Review of Systems  Constitutional:  Positive for malaise/fatigue.  HENT: Negative.    Eyes: Negative.   Respiratory:  Positive for shortness of breath.   Cardiovascular:  Positive for leg swelling.  Gastrointestinal: Negative.   Genitourinary: Negative.   Musculoskeletal: Negative.   Skin:  Negative.   Neurological: Negative.   Endo/Heme/Allergies: Negative.   Psychiatric/Behavioral: Negative.       Studies Reviewed: .        Cardiac Studies & Procedures   ______________________________________________________________________________________________   STRESS TESTS  MYOCARDIAL PERFUSION IMAGING 09/15/2022            ______________________________________________________________________________________________      Risk Assessment/Calculations:          Physical Exam:   VS:  There were no vitals taken for this visit.   Wt Readings from Last 3 Encounters:  07/01/23 194 lb (88 kg)  05/16/23 193 lb (87.5 kg)  05/06/23 193 lb 12.8 oz (87.9 kg)    GEN: In no acute distress NECK: No JVD; No carotid bruits CARDIAC: RRR, no murmurs, rubs, gallops RESPIRATORY: Trace Rales noted in right lower lobe ABDOMEN: Soft, non-tender, non-distended EXTREMITIES: Trace pedal edema; No deformity   ASSESSMENT AND PLAN: .   HFmrEF -NYHA class II, euvolemic.  Continue Entresto but we will increase his dose to 49-51 mg twice daily--most recent creatinine on 05/16/2023 was 0.78, potassium 4.0, continue Coreg 6.25 mg twice daily.  Will consider starting him on SGLT2 inhibitor at next OV, finances are very tight for him and his Social Security check was just cut by $300.   CAD-no documents to disclose what type of coronary artery disease he has. Stable with no anginal symptoms. No indication for ischemic evaluation.  He is not on aspirin, will address at next OV.  Hypertension-blood pressure is elevated in the office today at 158/82, we are going to increase his  Entresto to 49-51 mg twice daily.  Homelessness-our Child psychotherapist who is working with him and has helped obtain temporary housing.  Tobacco abuse-he is trying to cut back, currently smoking a little bit less than 1 PPD, he does want to quit but he is just not motivated to do so right now.       Dispo: Increase Entresto to  49 to 51 mg twice daily, repeat BMET in 2 weeks.  Follow-up in 3 months with me.  Signed, Flossie Dibble, NP

## 2023-09-30 ENCOUNTER — Ambulatory Visit: Payer: 59 | Attending: Cardiology | Admitting: Cardiology

## 2023-09-30 DIAGNOSIS — I2510 Atherosclerotic heart disease of native coronary artery without angina pectoris: Secondary | ICD-10-CM

## 2023-09-30 DIAGNOSIS — I509 Heart failure, unspecified: Secondary | ICD-10-CM

## 2023-09-30 DIAGNOSIS — I10 Essential (primary) hypertension: Secondary | ICD-10-CM

## 2023-12-25 ENCOUNTER — Other Ambulatory Visit: Payer: Self-pay | Admitting: Cardiology

## 2024-02-21 ENCOUNTER — Other Ambulatory Visit: Payer: Self-pay | Admitting: Cardiology

## 2024-02-21 DIAGNOSIS — I509 Heart failure, unspecified: Secondary | ICD-10-CM

## 2024-03-16 ENCOUNTER — Other Ambulatory Visit: Payer: Self-pay

## 2024-03-16 MED ORDER — CARVEDILOL 6.25 MG PO TABS: 6.2500 mg | ORAL_TABLET | Freq: Two times a day (BID) | ORAL | 0 refills | Status: DC

## 2024-08-10 ENCOUNTER — Other Ambulatory Visit: Payer: Self-pay

## 2024-08-11 MED ORDER — CARVEDILOL 6.25 MG PO TABS: 6.2500 mg | ORAL_TABLET | Freq: Two times a day (BID) | ORAL | 0 refills | Status: AC

## 2024-08-30 DEATH — deceased
